# Patient Record
Sex: Female | Born: 1975 | Race: White | Hispanic: No | Marital: Married | Smoking: Current every day smoker
Health system: Southern US, Community
[De-identification: ages and names within clinical notes are randomized; demographics above are authoritative.]

## PROBLEM LIST (undated history)

## (undated) DIAGNOSIS — F909 Attention-deficit hyperactivity disorder, unspecified type: Secondary | ICD-10-CM

## (undated) HISTORY — DX: Attention-deficit hyperactivity disorder, unspecified type: F90.9

---

## 2012-10-24 ENCOUNTER — Ambulatory Visit (INDEPENDENT_AMBULATORY_CARE_PROVIDER_SITE_OTHER): Payer: BC Managed Care – PPO | Admitting: Urology

## 2012-10-24 DIAGNOSIS — N390 Urinary tract infection, site not specified: Secondary | ICD-10-CM

## 2012-12-13 ENCOUNTER — Telehealth: Payer: Self-pay | Admitting: Nurse Practitioner

## 2012-12-14 ENCOUNTER — Other Ambulatory Visit: Payer: Self-pay | Admitting: *Deleted

## 2012-12-14 MED ORDER — AMPHETAMINE-DEXTROAMPHET ER 15 MG PO CP24: 15.0000 mg | ORAL_CAPSULE | ORAL | Status: DC

## 2012-12-14 NOTE — Telephone Encounter (Signed)
Family informed RX ready for pick up

## 2012-12-14 NOTE — Telephone Encounter (Signed)
LAST REFILL 11/13/12. LAST OV 10/23/12. PLEASE PRINT

## 2012-12-19 ENCOUNTER — Encounter: Payer: Self-pay | Admitting: *Deleted

## 2013-01-11 ENCOUNTER — Ambulatory Visit: Payer: Self-pay | Admitting: Nurse Practitioner

## 2013-01-12 ENCOUNTER — Ambulatory Visit: Payer: Self-pay | Admitting: Nurse Practitioner

## 2013-01-16 ENCOUNTER — Telehealth: Payer: Self-pay | Admitting: Nurse Practitioner

## 2013-01-18 ENCOUNTER — Encounter: Payer: Self-pay | Admitting: Nurse Practitioner

## 2013-01-18 ENCOUNTER — Ambulatory Visit (INDEPENDENT_AMBULATORY_CARE_PROVIDER_SITE_OTHER): Payer: BC Managed Care – PPO | Admitting: Nurse Practitioner

## 2013-01-18 VITALS — BP 107/77 | HR 79 | Temp 97.9°F | Ht 67.0 in | Wt 177.0 lb

## 2013-01-18 DIAGNOSIS — F900 Attention-deficit hyperactivity disorder, predominantly inattentive type: Secondary | ICD-10-CM

## 2013-01-18 DIAGNOSIS — F988 Other specified behavioral and emotional disorders with onset usually occurring in childhood and adolescence: Secondary | ICD-10-CM

## 2013-01-18 MED ORDER — AMPHETAMINE-DEXTROAMPHET ER 30 MG PO CP24: 30.0000 mg | ORAL_CAPSULE | ORAL | Status: DC

## 2013-01-18 NOTE — Progress Notes (Signed)
  Subjective:    Patient ID: Stephanie Buckley, female    DOB: [birth year], 37 y.o.   MRN: [id number]  HPI  Patient in today for follow-up of adult ADHD- SHe is currently on adderall XR 15mg  1 PO qd- She doesn't always take on the weekends- Doing well- able to concentrate better but not comppletely- says that she feels that she is on a roller coaster meaning able to concentrate for several hours then can't concentrate for an hour then back to concentrating again.    Review of Systems  All other systems reviewed and are negative.       Objective:   Physical Exam  Constitutional: She appears well-developed and well-nourished.  Cardiovascular: Normal rate and normal heart sounds.   Pulmonary/Chest: Effort normal and breath sounds normal.  Skin: Skin is warm and dry.  Psychiatric: She has a normal mood and affect. Her behavior is normal. Judgment and thought content normal.    BP 107/77  Pulse 79  Temp(Src) 97.9 F (36.6 C) (Oral)  Wt 177 lb (80.287 kg)  LMP 01/11/2013       Assessment & Plan:  adhd Meds ordered this encounter  Medications  . SPRINTEC 28 0.25-35 MG-MCG tablet    Sig:   . amphetamine-dextroamphetamine (ADDERALL XR) 30 MG 24 hr capsule    Sig: Take 1 capsule (30 mg total) by mouth every morning.    Dispense:  30 capsule    Refill:  0    Order Specific Question:  Supervising Provider    Answer:  Ernestina Penna [[id number]]  . amphetamine-dextroamphetamine (ADDERALL XR) 30 MG 24 hr capsule    Sig: Take 1 capsule (30 mg total) by mouth every morning.    Dispense:  30 capsule    Refill:  0    DO NOT FILL TILL 02/18/13    Order Specific Question:  Supervising Provider    Answer:  Ernestina Penna [[id number]]  behavior modification RTO in 2 months  Mary-Margaret Daphine Deutscher, FNP

## 2013-01-18 NOTE — Patient Instructions (Signed)
Attention Deficit Hyperactivity Disorder Attention deficit hyperactivity disorder (ADHD) is a problem with behavior issues based on the way the brain functions (neurobehavioral disorder). It is a common reason for behavior and academic problems in school. CAUSES  The cause of ADHD is unknown in most cases. It may run in families. It sometimes can be associated with learning disabilities and other behavioral problems. SYMPTOMS  There are 3 types of ADHD. The 3 types and some of the symptoms include:  Inattentive  Gets bored or distracted easily.  Loses or forgets things. Forgets to hand in homework.  Has trouble organizing or completing tasks.  Difficulty staying on task.  An inability to organize daily tasks and school work.  Leaving projects, chores, or homework unfinished.  Trouble paying attention or responding to details. Careless mistakes.  Difficulty following directions. Often seems like is not listening.  Dislikes activities that require sustained attention (like chores or homework).  Hyperactive-impulsive  Feels like it is impossible to sit still or stay in a seat. Fidgeting with hands and feet.  Trouble waiting turn.  Talking too much or out of turn. Interruptive.  Speaks or acts impulsively.  Aggressive, disruptive behavior.  Constantly busy or on the go, noisy.  Combined  Has symptoms of both of the above. Often children with ADHD feel discouraged about themselves and with school. They often perform well below their abilities in school. These symptoms can cause problems in home, school, and in relationships with peers. As children get older, the excess motor activities can calm down, but the problems with paying attention and staying organized persist. Most children do not outgrow ADHD but with good treatment can learn to cope with the symptoms. DIAGNOSIS  When ADHD is suspected, the diagnosis should be made by professionals trained in ADHD.  Diagnosis will  include:  Ruling out other reasons for the child's behavior.  The caregivers will check with the child's school and check their medical records.  They will talk to teachers and parents.  Behavior rating scales for the child will be filled out by those dealing with the child on a daily basis. A diagnosis is made only after all information has been considered. TREATMENT  Treatment usually includes behavioral treatment often along with medicines. It may include stimulant medicines. The stimulant medicines decrease impulsivity and hyperactivity and increase attention. Other medicines used include antidepressants and certain blood pressure medicines. Most experts agree that treatment for ADHD should address all aspects of the child's functioning. Treatment should not be limited to the use of medicines alone. Treatment should include structured classroom management. The parents must receive education to address rewarding good behavior, discipline, and limit-setting. Tutoring or behavioral therapy or both should be available for the child. If untreated, the disorder can have long-term serious effects into adolescence and adulthood. HOME CARE INSTRUCTIONS   Often with ADHD there is a lot of frustration among the family in dealing with the illness. There is often blame and anger that is not warranted. This is a life long illness. There is no way to prevent ADHD. In many cases, because the problem affects the family as a whole, the entire family may need help. A therapist can help the family find better ways to handle the disruptive behaviors and promote change. If the child is young, most of the therapist's work is with the parents. Parents will learn techniques for coping with and improving their child's behavior. Sometimes only the child with the ADHD needs counseling. Your caregivers can help   you make these decisions.  Children with ADHD may need help in organizing. Some helpful tips include:  Keep  routines the same every day from wake-up time to bedtime. Schedule everything. This includes homework and playtime. This should include outdoor and indoor recreation. Keep the schedule on the refrigerator or a bulletin board where it is frequently seen. Mark schedule changes as far in advance as possible.  Have a place for everything and keep everything in its place. This includes clothing, backpacks, and school supplies.  Encourage writing down assignments and bringing home needed books.  Offer your child a well-balanced diet. Breakfast is especially important for school performance. Children should avoid drinks with caffeine including:  Soft drinks.  Coffee.  Tea.  However, some older children (adolescents) may find these drinks helpful in improving their attention.  Children with ADHD need consistent rules that they can understand and follow. If rules are followed, give small rewards. Children with ADHD often receive, and expect, criticism. Look for good behavior and praise it. Set realistic goals. Give clear instructions. Look for activities that can foster success and self-esteem. Make time for pleasant activities with your child. Give lots of affection.  Parents are their children's greatest advocates. Learn as much as possible about ADHD. This helps you become a stronger and better advocate for your child. It also helps you educate your child's teachers and instructors if they feel inadequate in these areas. Parent support groups are often helpful. A national group with local chapters is called CHADD (Children and Adults with Attention Deficit Hyperactivity Disorder). PROGNOSIS  There is no cure for ADHD. Children with the disorder seldom outgrow it. Many find adaptive ways to accommodate the ADHD as they mature. SEEK MEDICAL CARE IF:  Your child has repeated muscle twitches, cough or speech outbursts.  Your child has sleep problems.  Your child has a marked loss of  appetite.  Your child develops depression.  Your child has new or worsening behavioral problems.  Your child develops dizziness.  Your child has a racing heart.  Your child has stomach pains.  Your child develops headaches. Document Released: 07/30/2002 Document Revised: 11/01/2011 Document Reviewed: 03/11/2008 [employer] Patient Information 2014 [employer].  

## 2013-01-30 NOTE — Telephone Encounter (Signed)
Error

## 2013-02-28 ENCOUNTER — Telehealth: Payer: Self-pay | Admitting: Nurse Practitioner

## 2013-03-02 MED ORDER — AMPHETAMINE-DEXTROAMPHET ER 30 MG PO CP24: 30.0000 mg | ORAL_CAPSULE | ORAL | Status: DC

## 2013-03-02 NOTE — Telephone Encounter (Signed)
Aware. 

## 2013-03-02 NOTE — Telephone Encounter (Signed)
rx ready for pickup 

## 2013-03-02 NOTE — Telephone Encounter (Signed)
Not out of meds, just wanted to give plenty of time, you can write it and put "do not fill till 03/20/13. Call pt, at [phone]

## 2013-04-11 ENCOUNTER — Other Ambulatory Visit: Payer: Self-pay | Admitting: Nurse Practitioner

## 2013-04-11 MED ORDER — AMPHETAMINE-DEXTROAMPHET ER 30 MG PO CP24: 30.0000 mg | ORAL_CAPSULE | ORAL | Status: DC

## 2013-04-11 NOTE — Telephone Encounter (Signed)
Last seen 01/18/13  MMM If approved print and route to nurse

## 2013-04-11 NOTE — Telephone Encounter (Signed)
rx ready for pickup 

## 2013-04-12 NOTE — Telephone Encounter (Signed)
Patient notified that rx up front 

## 2013-05-02 ENCOUNTER — Encounter: Payer: Self-pay | Admitting: General Practice

## 2013-05-02 ENCOUNTER — Ambulatory Visit (INDEPENDENT_AMBULATORY_CARE_PROVIDER_SITE_OTHER): Payer: BC Managed Care – PPO | Admitting: General Practice

## 2013-05-02 ENCOUNTER — Telehealth: Payer: Self-pay | Admitting: Nurse Practitioner

## 2013-05-02 VITALS — BP 111/79 | HR 79 | Temp 97.3°F | Ht 67.0 in | Wt 170.0 lb

## 2013-05-02 DIAGNOSIS — H109 Unspecified conjunctivitis: Secondary | ICD-10-CM

## 2013-05-02 DIAGNOSIS — J309 Allergic rhinitis, unspecified: Secondary | ICD-10-CM

## 2013-05-02 MED ORDER — CETIRIZINE HCL 10 MG PO TABS: 10.0000 mg | ORAL_TABLET | Freq: Every day | ORAL | Status: DC

## 2013-05-02 MED ORDER — OFLOXACIN 0.3 % OP SOLN: OPHTHALMIC | Status: DC

## 2013-05-02 NOTE — Patient Instructions (Addendum)

## 2013-05-02 NOTE — Progress Notes (Signed)
  Subjective:    Patient ID: Stephanie Buckley, female    DOB: 09-09-1975, 37 y.o.   MRN: [id number]  Conjunctivitis  The current episode started 2 days ago. The onset was gradual. The problem has been gradually worsening. The problem is mild. Nothing relieves the symptoms. Associated symptoms include eye itching and photophobia. Pertinent negatives include no fever, no decreased vision, no double vision, no congestion, no ear pain, no headaches, no hearing loss, no sore throat, no neck pain, no neck stiffness, no cough, no URI, no rash, no eye discharge and no eye pain.      Review of Systems  Constitutional: Negative for fever and chills.  HENT: Negative for hearing loss, ear pain, congestion, sore throat, neck pain and neck stiffness.   Eyes: Positive for photophobia and itching. Negative for double vision, pain, discharge and visual disturbance.  Respiratory: Negative for cough and chest tightness.   Cardiovascular: Negative for chest pain and palpitations.  Skin: Negative for rash.  Neurological: Negative for dizziness, weakness and headaches.       Objective:   Physical Exam  Constitutional: She is oriented to person, place, and time. She appears well-developed and well-nourished.  HENT:  Head: Normocephalic and atraumatic.  Right Ear: External ear normal.  Left Ear: External ear normal.  Mouth/Throat: Posterior oropharyngeal erythema present.  Eyes: EOM are normal. Pupils are equal, round, and reactive to light. Right conjunctiva is injected. Right conjunctiva has no hemorrhage. Left conjunctiva is not injected. Left conjunctiva has no hemorrhage.  Neck: Normal range of motion. Neck supple. No thyromegaly present.  Pulmonary/Chest: Effort normal and breath sounds normal.  Lymphadenopathy:    She has no cervical adenopathy.  Neurological: She is alert and oriented to person, place, and time.  Skin: Skin is warm and dry.          Assessment & Plan:  1. Conjunctivitis -  ofloxacin (OCUFLOX) 0.3 % ophthalmic solution; 2 drops in affected eye (s), every 4 hours x 2 days, then 1 drop four times daily for 5 days  Dispense: 10 mL; Refill: 1 -information sheet provided and discussed on conjunctivitis  -RTO office if symptoms worsen or unresolved -Patient verbalized understanding -Coralie Keens, FNP-C   2. Allergic rhinitis - cetirizine (ZYRTEC) 10 MG tablet; Take 1 tablet (10 mg total) by mouth daily.  Dispense: 30 tablet; Refill: 6

## 2013-05-02 NOTE — Telephone Encounter (Signed)
appt made

## 2013-05-11 ENCOUNTER — Ambulatory Visit (INDEPENDENT_AMBULATORY_CARE_PROVIDER_SITE_OTHER): Payer: BC Managed Care – PPO | Admitting: Nurse Practitioner

## 2013-05-11 ENCOUNTER — Encounter: Payer: Self-pay | Admitting: Nurse Practitioner

## 2013-05-11 VITALS — BP 110/73 | HR 83 | Temp 97.2°F | Ht 67.0 in | Wt 170.0 lb

## 2013-05-11 DIAGNOSIS — F908 Attention-deficit hyperactivity disorder, other type: Secondary | ICD-10-CM | POA: Insufficient documentation

## 2013-05-11 DIAGNOSIS — F909 Attention-deficit hyperactivity disorder, unspecified type: Secondary | ICD-10-CM

## 2013-05-11 MED ORDER — AMPHETAMINE-DEXTROAMPHET ER 30 MG PO CP24: 30.0000 mg | ORAL_CAPSULE | ORAL | Status: DC

## 2013-05-11 NOTE — Progress Notes (Signed)
  Subjective:    Patient ID: Stephanie Buckley, female    DOB: [birth year], 37 y.o.   MRN: [id number]  HPI Pt here for f/u with ADHD. Pt currently Adderall XR 30 daily. Pt denies any complaints or concerns. Pt reports a hugh improvement on concentration.    Review of Systems  All other systems reviewed and are negative.       Objective:   Physical Exam  Vitals reviewed. Constitutional: She is oriented to person, place, and time.  Cardiovascular: Normal rate, regular rhythm, normal heart sounds and intact distal pulses.   Pulmonary/Chest: Effort normal and breath sounds normal. No respiratory distress.  Musculoskeletal: Normal range of motion. She exhibits no edema and no tenderness.  Neurological: She is alert and oriented to person, place, and time.  Psychiatric: She has a normal mood and affect. Her behavior is normal. Judgment and thought content normal.   BP 110/73  Pulse 83  Temp(Src) 97.2 F (36.2 C) (Oral)  Ht 5\' 7"  (1.702 m)  Wt 170 lb (77.111 kg)  BMI 26.62 kg/m2        Assessment & Plan:  1. ADHD (attention deficit hyperactivity disorder) Continue stress management - amphetamine-dextroamphetamine (ADDERALL XR) 30 MG 24 hr capsule; Take 1 capsule (30 mg total) by mouth every morning.  Dispense: 30 capsule; Refill: 0 - amphetamine-dextroamphetamine (ADDERALL XR) 30 MG 24 hr capsule; Take 1 capsule (30 mg total) by mouth every morning.  Dispense: 30 capsule; Refill: 0 DO NOT FILL TILL 06/09/13  Follow up in 2-3 months  Mary-Margaret Daphine Deutscher, FNP

## 2013-05-11 NOTE — Patient Instructions (Addendum)
Attention Deficit Hyperactivity Disorder Attention deficit hyperactivity disorder (ADHD) is a problem with behavior issues based on the way the brain functions (neurobehavioral disorder). It is a common reason for behavior and academic problems in school. CAUSES  The cause of ADHD is unknown in most cases. It may run in families. It sometimes can be associated with learning disabilities and other behavioral problems. SYMPTOMS  There are 3 types of ADHD. The 3 types and some of the symptoms include:  Inattentive  Gets bored or distracted easily.  Loses or forgets things. Forgets to hand in homework.  Has trouble organizing or completing tasks.  Difficulty staying on task.  An inability to organize daily tasks and school work.  Leaving projects, chores, or homework unfinished.  Trouble paying attention or responding to details. Careless mistakes.  Difficulty following directions. Often seems like is not listening.  Dislikes activities that require sustained attention (like chores or homework).  Hyperactive-impulsive  Feels like it is impossible to sit still or stay in a seat. Fidgeting with hands and feet.  Trouble waiting turn.  Talking too much or out of turn. Interruptive.  Speaks or acts impulsively.  Aggressive, disruptive behavior.  Constantly busy or on the go, noisy.  Combined  Has symptoms of both of the above. Often children with ADHD feel discouraged about themselves and with school. They often perform well below their abilities in school. These symptoms can cause problems in home, school, and in relationships with peers. As children get older, the excess motor activities can calm down, but the problems with paying attention and staying organized persist. Most children do not outgrow ADHD but with good treatment can learn to cope with the symptoms. DIAGNOSIS  When ADHD is suspected, the diagnosis should be made by professionals trained in ADHD.  Diagnosis will  include:  Ruling out other reasons for the child's behavior.  The caregivers will check with the child's school and check their medical records.  They will talk to teachers and parents.  Behavior rating scales for the child will be filled out by those dealing with the child on a daily basis. A diagnosis is made only after all information has been considered. TREATMENT  Treatment usually includes behavioral treatment often along with medicines. It may include stimulant medicines. The stimulant medicines decrease impulsivity and hyperactivity and increase attention. Other medicines used include antidepressants and certain blood pressure medicines. Most experts agree that treatment for ADHD should address all aspects of the child's functioning. Treatment should not be limited to the use of medicines alone. Treatment should include structured classroom management. The parents must receive education to address rewarding good behavior, discipline, and limit-setting. Tutoring or behavioral therapy or both should be available for the child. If untreated, the disorder can have long-term serious effects into adolescence and adulthood. HOME CARE INSTRUCTIONS   Often with ADHD there is a lot of frustration among the family in dealing with the illness. There is often blame and anger that is not warranted. This is a life long illness. There is no way to prevent ADHD. In many cases, because the problem affects the family as a whole, the entire family may need help. A therapist can help the family find better ways to handle the disruptive behaviors and promote change. If the child is young, most of the therapist's work is with the parents. Parents will learn techniques for coping with and improving their child's behavior. Sometimes only the child with the ADHD needs counseling. Your caregivers can help   you make these decisions.  Children with ADHD may need help in organizing. Some helpful tips include:  Keep  routines the same every day from wake-up time to bedtime. Schedule everything. This includes homework and playtime. This should include outdoor and indoor recreation. Keep the schedule on the refrigerator or a bulletin board where it is frequently seen. Mark schedule changes as far in advance as possible.  Have a place for everything and keep everything in its place. This includes clothing, backpacks, and school supplies.  Encourage writing down assignments and bringing home needed books.  Offer your child a well-balanced diet. Breakfast is especially important for school performance. Children should avoid drinks with caffeine including:  Soft drinks.  Coffee.  Tea.  However, some older children (adolescents) may find these drinks helpful in improving their attention.  Children with ADHD need consistent rules that they can understand and follow. If rules are followed, give small rewards. Children with ADHD often receive, and expect, criticism. Look for good behavior and praise it. Set realistic goals. Give clear instructions. Look for activities that can foster success and self-esteem. Make time for pleasant activities with your child. Give lots of affection.  Parents are their children's greatest advocates. Learn as much as possible about ADHD. This helps you become a stronger and better advocate for your child. It also helps you educate your child's teachers and instructors if they feel inadequate in these areas. Parent support groups are often helpful. A national group with local chapters is called CHADD (Children and Adults with Attention Deficit Hyperactivity Disorder). PROGNOSIS  There is no cure for ADHD. Children with the disorder seldom outgrow it. Many find adaptive ways to accommodate the ADHD as they mature. SEEK MEDICAL CARE IF:  Your child has repeated muscle twitches, cough or speech outbursts.  Your child has sleep problems.  Your child has a marked loss of  appetite.  Your child develops depression.  Your child has new or worsening behavioral problems.  Your child develops dizziness.  Your child has a racing heart.  Your child has stomach pains.  Your child develops headaches. Document Released: 07/30/2002 Document Revised: 11/01/2011 Document Reviewed: 03/11/2008 [employer] Patient Information 2014 [employer].  

## 2013-07-04 ENCOUNTER — Other Ambulatory Visit: Payer: Self-pay | Admitting: Nurse Practitioner

## 2013-07-04 DIAGNOSIS — F909 Attention-deficit hyperactivity disorder, unspecified type: Secondary | ICD-10-CM

## 2013-07-04 MED ORDER — AMPHETAMINE-DEXTROAMPHET ER 30 MG PO CP24: 30.0000 mg | ORAL_CAPSULE | ORAL | Status: DC

## 2013-07-11 ENCOUNTER — Ambulatory Visit: Payer: BC Managed Care – PPO | Admitting: Nurse Practitioner

## 2013-08-09 ENCOUNTER — Telehealth: Payer: Self-pay | Admitting: Nurse Practitioner

## 2013-08-09 DIAGNOSIS — F909 Attention-deficit hyperactivity disorder, unspecified type: Secondary | ICD-10-CM

## 2013-08-09 MED ORDER — AMPHETAMINE-DEXTROAMPHET ER 30 MG PO CP24: 30.0000 mg | ORAL_CAPSULE | ORAL | Status: DC

## 2013-08-09 NOTE — Telephone Encounter (Signed)
Patient aware.

## 2013-08-09 NOTE — Telephone Encounter (Signed)
rx ready for pickup 

## 2013-09-05 ENCOUNTER — Encounter: Payer: Self-pay | Admitting: Nurse Practitioner

## 2013-09-05 ENCOUNTER — Ambulatory Visit (INDEPENDENT_AMBULATORY_CARE_PROVIDER_SITE_OTHER): Payer: BC Managed Care – PPO | Admitting: Nurse Practitioner

## 2013-09-05 VITALS — BP 116/74 | HR 92 | Temp 97.0°F | Ht 67.0 in | Wt 175.0 lb

## 2013-09-05 DIAGNOSIS — F909 Attention-deficit hyperactivity disorder, unspecified type: Secondary | ICD-10-CM

## 2013-09-05 MED ORDER — AMPHETAMINE-DEXTROAMPHET ER 30 MG PO CP24: 30.0000 mg | ORAL_CAPSULE | ORAL | Status: DC

## 2013-09-05 NOTE — Progress Notes (Signed)
   Subjective:    Patient ID: Stephanie Buckley, female    DOB: 04-02-1976, 38 y.o.   MRN: [id number]  HPI  Patient in today for ADHD follow up- SHe is currently on adderallXR 30 mg dialy- helping her to concentrate and feel normal- able to complete tasks at work. No side effects.    Review of Systems  Constitutional: Negative.   HENT: Negative.   Respiratory: Negative.   Cardiovascular: Negative.   Genitourinary: Negative.   All other systems reviewed and are negative.       Objective:   Physical Exam  Constitutional: She appears well-developed and well-nourished.  Cardiovascular: Normal rate, regular rhythm and normal heart sounds.   Pulmonary/Chest: Effort normal and breath sounds normal.  Neurological: She is alert.  Skin: Skin is warm and dry.  Psychiatric: She has a normal mood and affect. Her behavior is normal. Judgment and thought content normal.    BP 116/74  Pulse 92  Temp(Src) 97 F (36.1 C) (Oral)  Ht 5\' 7"  (1.702 m)  Wt 175 lb (79.379 kg)  BMI 27.40 kg/m2       Assessment & Plan:   1. ADHD (attention deficit hyperactivity disorder)    Meds ordered this encounter  Medications  . amphetamine-dextroamphetamine (ADDERALL XR) 30 MG 24 hr capsule    Sig: Take 1 capsule (30 mg total) by mouth every morning.    Dispense:  30 capsule    Refill:  0    DO NOT FILL TILL 10/05/13    Order Specific Question:  Supervising Provider    Answer:  Chipper Herb [[id number]]  . amphetamine-dextroamphetamine (ADDERALL XR) 30 MG 24 hr capsule    Sig: Take 1 capsule (30 mg total) by mouth every morning.    Dispense:  30 capsule    Refill:  0    Order Specific Question:  Supervising Provider    Answer:  Chipper Herb [[id number]]   Stress management Follow up in 2 months  Mary-Margaret Hassell Done, FNP

## 2013-09-05 NOTE — Patient Instructions (Signed)
Stress Management Stress is a state of physical or mental tension that often results from changes in your life or normal routine. Some common causes of stress are:  Death of a loved one.  Injuries or severe illnesses.  Getting fired or changing jobs.  Moving into a new home. Other causes may be:  Sexual problems.  Business or financial losses.  Taking on a large debt.  Regular conflict with someone at home or at work.  Constant tiredness from lack of sleep. It is not just bad things that are stressful. It may be stressful to:  Win the lottery.  Get married.  Buy a new car. The amount of stress that can be easily tolerated varies from person to person. Changes generally cause stress, regardless of the types of change. Too much stress can affect your health. It may lead to physical or emotional problems. Too little stress (boredom) may also become stressful. SUGGESTIONS TO REDUCE STRESS:  Talk things over with your family and friends. It often is helpful to share your concerns and worries. If you feel your problem is serious, you may want to get help from a professional counselor.  Consider your problems one at a time instead of lumping them all together. Trying to take care of everything at once may seem impossible. List all the things you need to do and then start with the most important one. Set a goal to accomplish 2 or 3 things each day. If you expect to do too many in a single day you will naturally fail, causing you to feel even more stressed.  Do not use alcohol or drugs to relieve stress. Although you may feel better for a short time, they do not remove the problems that caused the stress. They can also be habit forming.  Exercise regularly - at least 3 times per week. Physical exercise can help to relieve that "uptight" feeling and will relax you.  The shortest distance between despair and hope is often a good night's sleep.  Go to bed and get up on time allowing  yourself time for appointments without being rushed.  Take a short "time-out" period from any stressful situation that occurs during the day. Close your eyes and take some deep breaths. Starting with the muscles in your face, tense them, hold it for a few seconds, then relax. Repeat this with the muscles in your neck, shoulders, hand, stomach, back and legs.  Take good care of yourself. Eat a balanced diet and get plenty of rest.  Schedule time for having fun. Take a break from your daily routine to relax. HOME CARE INSTRUCTIONS   Call if you feel overwhelmed by your problems and feel you can no longer manage them on your own.  Return immediately if you feel like hurting yourself or someone else. Document Released: 02/02/2001 Document Revised: 11/01/2011 Document Reviewed: 04/03/2013 [employer] Patient Information 2014 [employer].  

## 2013-09-26 ENCOUNTER — Telehealth: Payer: Self-pay | Admitting: Nurse Practitioner

## 2013-09-26 NOTE — Telephone Encounter (Signed)
Patient called back concerned about flu exposure. They spent the weekend with a child who developed symptoms while with them 3 days ago and then tested positive yesterday. Do you recommend prophylactic treatment for flu?

## 2013-09-26 NOTE — Telephone Encounter (Signed)
Please advise 

## 2013-09-27 ENCOUNTER — Other Ambulatory Visit: Payer: Self-pay | Admitting: Nurse Practitioner

## 2013-09-27 ENCOUNTER — Telehealth: Payer: Self-pay | Admitting: Nurse Practitioner

## 2013-09-27 MED ORDER — OSELTAMIVIR PHOSPHATE 75 MG PO CAPS: 75.0000 mg | ORAL_CAPSULE | Freq: Every day | ORAL | Status: DC

## 2013-09-27 MED ORDER — OSELTAMIVIR PHOSPHATE 6 MG/ML PO SUSR: 45.0000 mg | Freq: Every day | ORAL | Status: DC

## 2013-09-27 NOTE — Telephone Encounter (Signed)
This is suppose to be 75mg  daily for 10 days. Pharmacy notified.

## 2013-11-09 ENCOUNTER — Telehealth: Payer: Self-pay | Admitting: Family Medicine

## 2013-11-09 DIAGNOSIS — F909 Attention-deficit hyperactivity disorder, unspecified type: Secondary | ICD-10-CM

## 2013-11-09 MED ORDER — AMPHETAMINE-DEXTROAMPHET ER 30 MG PO CP24: 30.0000 mg | ORAL_CAPSULE | ORAL | Status: DC

## 2013-11-09 NOTE — Telephone Encounter (Signed)
Patient aware.

## 2013-11-09 NOTE — Telephone Encounter (Signed)
rx ready for pickup 

## 2013-11-12 ENCOUNTER — Telehealth: Payer: Self-pay | Admitting: Nurse Practitioner

## 2013-11-28 ENCOUNTER — Ambulatory Visit (INDEPENDENT_AMBULATORY_CARE_PROVIDER_SITE_OTHER): Payer: BC Managed Care – PPO | Admitting: Nurse Practitioner

## 2013-11-28 ENCOUNTER — Encounter: Payer: Self-pay | Admitting: Nurse Practitioner

## 2013-11-28 VITALS — BP 115/71 | HR 88 | Temp 98.2°F | Ht 67.0 in | Wt 175.4 lb

## 2013-11-28 DIAGNOSIS — F909 Attention-deficit hyperactivity disorder, unspecified type: Secondary | ICD-10-CM

## 2013-11-28 MED ORDER — AMPHETAMINE-DEXTROAMPHET ER 30 MG PO CP24: 30.0000 mg | ORAL_CAPSULE | ORAL | Status: DC

## 2013-11-28 NOTE — Patient Instructions (Signed)
Attention Deficit Hyperactivity Disorder Attention deficit hyperactivity disorder (ADHD) is a problem with behavior issues based on the way the brain functions (neurobehavioral disorder). It is a common reason for behavior and academic problems in school. SYMPTOMS  There are 3 types of ADHD. The 3 types and some of the symptoms include:  Inattentive  Gets bored or distracted easily.  Loses or forgets things. Forgets to hand in homework.  Has trouble organizing or completing tasks.  Difficulty staying on task.  An inability to organize daily tasks and school work.  Leaving projects, chores, or homework unfinished.  Trouble paying attention or responding to details. Careless mistakes.  Difficulty following directions. Often seems like is not listening.  Dislikes activities that require sustained attention (like chores or homework).  Hyperactive-impulsive  Feels like it is impossible to sit still or stay in a seat. Fidgeting with hands and feet.  Trouble waiting turn.  Talking too much or out of turn. Interruptive.  Speaks or acts impulsively.  Aggressive, disruptive behavior.  Constantly busy or on the go, noisy.  Often leaves seat when they are expected to remain seated.  Often runs or climbs where it is not appropriate, or feels very restless.  Combined  Has symptoms of both of the above. Often children with ADHD feel discouraged about themselves and with school. They often perform well below their abilities in school. As children get older, the excess motor activities can calm down, but the problems with paying attention and staying organized persist. Most children do not outgrow ADHD but with good treatment can learn to cope with the symptoms. DIAGNOSIS  When ADHD is suspected, the diagnosis should be made by professionals trained in ADHD. This professional will collect information about the individual suspected of having ADHD. Information must be collected from  various settings where the person lives, works, or attends school.  Diagnosis will include:  Confirming symptoms began in childhood.  Ruling out other reasons for the child's behavior.  The health care providers will check with the child's school and check their medical records.  They will talk to teachers and parents.  Behavior rating scales for the child will be filled out by those dealing with the child on a daily basis. A diagnosis is made only after all information has been considered. TREATMENT  Treatment usually includes behavioral treatment, tutoring or extra support in school, and stimulant medicines. Because of the way a person's brain works with ADHD, these medicines decrease impulsivity and hyperactivity and increase attention. This is different than how they would work in a person who does not have ADHD. Other medicines used include antidepressants and certain blood pressure medicines. Most experts agree that treatment for ADHD should address all aspects of the person's functioning. Along with medicines, treatment should include structured classroom management at school. Parents should reward good behavior, provide constant discipline, and limit-setting. Tutoring should be available for the child as needed. ADHD is a life-long condition. If untreated, the disorder can have long-term serious effects into adolescence and adulthood. HOME CARE INSTRUCTIONS   Often with ADHD there is a lot of frustration among family members dealing with the condition. Blame and anger are also feelings that are common. In many cases, because the problem affects the family as a whole, the entire family may need help. A therapist can help the family find better ways to handle the disruptive behaviors of the person with ADHD and promote change. If the person with ADHD is young, most of the therapist's work   is with the parents. Parents will learn techniques for coping with and improving their child's  behavior. Sometimes only the child with the ADHD needs counseling. Your health care providers can help you make these decisions.  Children with ADHD may need help learning how to organize. Some helpful tips include:  Keep routines the same every day from wake-up time to bedtime. Schedule all activities, including homework and playtime. Keep the schedule in a place where the person with ADHD will often see it. Mark schedule changes as far in advance as possible.  Schedule outdoor and indoor recreation.  Have a place for everything and keep everything in its place. This includes clothing, backpacks, and school supplies.  Encourage writing down assignments and bringing home needed books. Work with your child's teachers for assistance in organizing school work.  Offer your child a well-balanced diet. Breakfast that includes a balance of whole grains, protein and, fruits or vegetables is especially important for school performance. Children should avoid drinks with caffeine including:  Soft drinks.  Coffee.  Tea.  However, some older children (adolescents) may find these drinks helpful in improving their attention. Because it can also be common for adolescents with ADHD to become addicted to caffeine, talk with your health care provider about what is a safe amount of caffeine intake for your child.  Children with ADHD need consistent rules that they can understand and follow. If rules are followed, give small rewards. Children with ADHD often receive, and expect, criticism. Look for good behavior and praise it. Set realistic goals. Give clear instructions. Look for activities that can foster success and self-esteem. Make time for pleasant activities with your child. Give lots of affection.  Parents are their children's greatest advocates. Learn as much as possible about ADHD. This helps you become a stronger and better advocate for your child. It also helps you educate your child's teachers and  instructors if they feel inadequate in these areas. Parent support groups are often helpful. A national group with local chapters is called Children and Adults with Attention Deficit Hyperactivity Disorder (CHADD). SEEK MEDICAL CARE IF:  Your child has repeated muscle twitches, cough or speech outbursts.  Your child has sleep problems.  Your child has a marked loss of appetite.  Your child develops depression.  Your child has new or worsening behavioral problems.  Your child develops dizziness.  Your child has a racing heart.  Your child has stomach pains.  Your child develops headaches. SEEK IMMEDIATE MEDICAL CARE IF:  Your child has been diagnosed with depression or anxiety and the symptoms seem to be getting worse.  Your child has been depressed and suddenly appears to have increased energy or motivation.  You are worried that your child is having a bad reaction to a medication he or she is taking for ADHD. Document Released: 07/30/2002 Document Revised: 05/30/2013 Document Reviewed: 04/16/2013 [employer] Patient Information 2014 [employer].  

## 2013-11-28 NOTE — Progress Notes (Signed)
   Subjective:    Patient ID: Stephanie Buckley, female    DOB: 1976/08/13, 38 y.o.   MRN: [id number]  HPI Patient in today for follow up of ADD- SH e ia currently on adderallXR 30 mg daily- DOing well on current dose- able to focus well and get work done at work. No side effects.    Review of Systems  Constitutional: Negative.   HENT: Negative.   Respiratory: Negative.   Cardiovascular: Negative.   Musculoskeletal: Negative.   Psychiatric/Behavioral: Negative.   All other systems reviewed and are negative.      Objective:   Physical Exam  Constitutional: She is oriented to person, place, and time. She appears well-developed and well-nourished.  Cardiovascular: Normal rate, regular rhythm and normal heart sounds.   Pulmonary/Chest: Effort normal and breath sounds normal.  Neurological: She is alert and oriented to person, place, and time.  Skin: Skin is warm.  Psychiatric: She has a normal mood and affect. Her behavior is normal. Judgment and thought content normal.   BP 115/71  Pulse 88  Temp(Src) 98.2 F (36.8 C) (Oral)  Ht 5\' 7"  (1.702 m)  Wt 175 lb 6.4 oz (79.561 kg)  BMI 27.47 kg/m2        Assessment & Plan:   1. ADHD (attention deficit hyperactivity disorder)    Meds ordered this encounter  Medications  . amphetamine-dextroamphetamine (ADDERALL XR) 30 MG 24 hr capsule    Sig: Take 1 capsule (30 mg total) by mouth every morning.    Dispense:  30 capsule    Refill:  0    Order Specific Question:  Supervising Provider    Answer:  Chipper Herb [[id number]]  . amphetamine-dextroamphetamine (ADDERALL XR) 30 MG 24 hr capsule    Sig: Take 1 capsule (30 mg total) by mouth every morning.    Dispense:  30 capsule    Refill:  0    DO  NOT FILL TILL 12/28/13    Order Specific Question:  Supervising Provider    Answer:  Chipper Herb [[id number]]  patient will schedule CPE Meds as prescribed Behavior modification as needed Follow-up for recheck in 2 months  Mary-Margaret  Hassell Done, FNP

## 2013-12-28 ENCOUNTER — Ambulatory Visit (INDEPENDENT_AMBULATORY_CARE_PROVIDER_SITE_OTHER): Payer: BC Managed Care – PPO | Admitting: Nurse Practitioner

## 2013-12-28 ENCOUNTER — Encounter: Payer: Self-pay | Admitting: Nurse Practitioner

## 2013-12-28 VITALS — BP 115/75 | HR 75 | Temp 98.6°F | Wt 177.8 lb

## 2013-12-28 DIAGNOSIS — D239 Other benign neoplasm of skin, unspecified: Secondary | ICD-10-CM

## 2013-12-28 DIAGNOSIS — D229 Melanocytic nevi, unspecified: Secondary | ICD-10-CM

## 2013-12-28 NOTE — Progress Notes (Signed)
   Subjective:    Patient ID: Stephanie Buckley, female    DOB: Jul 01, 1976, 38 y.o.   MRN: [id number]  HPI Patient here today for skin tag removal.    Review of Systems  All other systems reviewed and are negative.      Objective:   Physical Exam  Constitutional: She appears well-developed and well-nourished.  Cardiovascular: Normal rate, regular rhythm and normal heart sounds.   Skin:  1 cm flesh colored lesion right post shoulder 1cm red pigmented lesion mid upper abd wall   Procedure- Both areas numbed with lido 2% with epi   Cleaned with betadine   Lesions shaved off and cauterized with silver nitrate sticks   Cleaned and band aid applied       Assessment & Plan:  1. Benign mole removal Keep clean and dry Watch for signs of infection rto prn  Mary-Margaret Hassell Done, FNP

## 2014-02-04 ENCOUNTER — Telehealth: Payer: Self-pay | Admitting: Nurse Practitioner

## 2014-02-04 DIAGNOSIS — F909 Attention-deficit hyperactivity disorder, unspecified type: Secondary | ICD-10-CM

## 2014-02-04 MED ORDER — AMPHETAMINE-DEXTROAMPHET ER 30 MG PO CP24: 30.0000 mg | ORAL_CAPSULE | ORAL | Status: DC

## 2014-02-04 NOTE — Telephone Encounter (Signed)
Message left on cell phone that rx is up front and ready to be picked up

## 2014-02-04 NOTE — Telephone Encounter (Signed)
rx ready for pickup 

## 2014-02-05 ENCOUNTER — Encounter: Payer: Self-pay | Admitting: Nurse Practitioner

## 2014-02-05 ENCOUNTER — Ambulatory Visit (INDEPENDENT_AMBULATORY_CARE_PROVIDER_SITE_OTHER): Payer: BC Managed Care – PPO | Admitting: Nurse Practitioner

## 2014-02-05 VITALS — BP 108/78 | HR 94 | Temp 98.2°F | Ht 67.0 in | Wt 179.2 lb

## 2014-02-05 DIAGNOSIS — F909 Attention-deficit hyperactivity disorder, unspecified type: Secondary | ICD-10-CM

## 2014-02-05 DIAGNOSIS — F908 Attention-deficit hyperactivity disorder, other type: Secondary | ICD-10-CM

## 2014-02-05 MED ORDER — AMPHETAMINE-DEXTROAMPHET ER 30 MG PO CP24: 30.0000 mg | ORAL_CAPSULE | ORAL | Status: DC

## 2014-02-05 NOTE — Patient Instructions (Signed)
Attention Deficit Hyperactivity Disorder Attention deficit hyperactivity disorder (ADHD) is a problem with behavior issues based on the way the brain functions (neurobehavioral disorder). It is a common reason for behavior and academic problems in school. SYMPTOMS  There are 3 types of ADHD. The 3 types and some of the symptoms include:  Inattentive  Gets bored or distracted easily.  Loses or forgets things. Forgets to hand in homework.  Has trouble organizing or completing tasks.  Difficulty staying on task.  An inability to organize daily tasks and school work.  Leaving projects, chores, or homework unfinished.  Trouble paying attention or responding to details. Careless mistakes.  Difficulty following directions. Often seems like is not listening.  Dislikes activities that require sustained attention (like chores or homework).  Hyperactive-impulsive  Feels like it is impossible to sit still or stay in a seat. Fidgeting with hands and feet.  Trouble waiting turn.  Talking too much or out of turn. Interruptive.  Speaks or acts impulsively.  Aggressive, disruptive behavior.  Constantly busy or on the go, noisy.  Often leaves seat when they are expected to remain seated.  Often runs or climbs where it is not appropriate, or feels very restless.  Combined  Has symptoms of both of the above. Often children with ADHD feel discouraged about themselves and with school. They often perform well below their abilities in school. As children get older, the excess motor activities can calm down, but the problems with paying attention and staying organized persist. Most children do not outgrow ADHD but with good treatment can learn to cope with the symptoms. DIAGNOSIS  When ADHD is suspected, the diagnosis should be made by professionals trained in ADHD. This professional will collect information about the individual suspected of having ADHD. Information must be collected from  various settings where the person lives, works, or attends school.  Diagnosis will include:  Confirming symptoms began in childhood.  Ruling out other reasons for the child's behavior.  The health care providers will check with the child's school and check their medical records.  They will talk to teachers and parents.  Behavior rating scales for the child will be filled out by those dealing with the child on a daily basis. A diagnosis is made only after all information has been considered. TREATMENT  Treatment usually includes behavioral treatment, tutoring or extra support in school, and stimulant medicines. Because of the way a person's brain works with ADHD, these medicines decrease impulsivity and hyperactivity and increase attention. This is different than how they would work in a person who does not have ADHD. Other medicines used include antidepressants and certain blood pressure medicines. Most experts agree that treatment for ADHD should address all aspects of the person's functioning. Along with medicines, treatment should include structured classroom management at school. Parents should reward good behavior, provide constant discipline, and limit-setting. Tutoring should be available for the child as needed. ADHD is a life-long condition. If untreated, the disorder can have long-term serious effects into adolescence and adulthood. HOME CARE INSTRUCTIONS   Often with ADHD there is a lot of frustration among family members dealing with the condition. Blame and anger are also feelings that are common. In many cases, because the problem affects the family as a whole, the entire family may need help. A therapist can help the family find better ways to handle the disruptive behaviors of the person with ADHD and promote change. If the person with ADHD is young, most of the therapist's work   is with the parents. Parents will learn techniques for coping with and improving their child's  behavior. Sometimes only the child with the ADHD needs counseling. Your health care providers can help you make these decisions.  Children with ADHD may need help learning how to organize. Some helpful tips include:  Keep routines the same every day from wake-up time to bedtime. Schedule all activities, including homework and playtime. Keep the schedule in a place where the person with ADHD will often see it. Mark schedule changes as far in advance as possible.  Schedule outdoor and indoor recreation.  Have a place for everything and keep everything in its place. This includes clothing, backpacks, and school supplies.  Encourage writing down assignments and bringing home needed books. Work with your child's teachers for assistance in organizing school work.  Offer your child a well-balanced diet. Breakfast that includes a balance of whole grains, protein and, fruits or vegetables is especially important for school performance. Children should avoid drinks with caffeine including:  Soft drinks.  Coffee.  Tea.  However, some older children (adolescents) may find these drinks helpful in improving their attention. Because it can also be common for adolescents with ADHD to become addicted to caffeine, talk with your health care Yadriel Kerrigan about what is a safe amount of caffeine intake for your child.  Children with ADHD need consistent rules that they can understand and follow. If rules are followed, give small rewards. Children with ADHD often receive, and expect, criticism. Look for good behavior and praise it. Set realistic goals. Give clear instructions. Look for activities that can foster success and self-esteem. Make time for pleasant activities with your child. Give lots of affection.  Parents are their children's greatest advocates. Learn as much as possible about ADHD. This helps you become a stronger and better advocate for your child. It also helps you educate your child's teachers and  instructors if they feel inadequate in these areas. Parent support groups are often helpful. A national group with local chapters is called Children and Adults with Attention Deficit Hyperactivity Disorder (CHADD). SEEK MEDICAL CARE IF:  Your child has repeated muscle twitches, cough or speech outbursts.  Your child has sleep problems.  Your child has a marked loss of appetite.  Your child develops depression.  Your child has new or worsening behavioral problems.  Your child develops dizziness.  Your child has a racing heart.  Your child has stomach pains.  Your child develops headaches. SEEK IMMEDIATE MEDICAL CARE IF:  Your child has been diagnosed with depression or anxiety and the symptoms seem to be getting worse.  Your child has been depressed and suddenly appears to have increased energy or motivation.  You are worried that your child is having a bad reaction to a medication he or she is taking for ADHD. Document Released: 07/30/2002 Document Revised: 05/30/2013 Document Reviewed: 04/16/2013 [employer] Patient Information 2014 [employer].  

## 2014-02-05 NOTE — Progress Notes (Signed)
   Subjective:    Patient ID: Stephanie Buckley, female    DOB: 1976-05-17, 38 y.o.   MRN: [id number]  [facility]R 30 daily. Doing well on dose- helps her to concentrate.    Review of Systems  Constitutional: Negative.   HENT: Negative.   Respiratory: Negative.   Cardiovascular: Negative.   Genitourinary: Negative.   Psychiatric/Behavioral: Negative.   All other systems reviewed and are negative.      Objective:   Physical Exam  Constitutional: She is oriented to person, place, and time. She appears well-developed and well-nourished.  Cardiovascular: Normal rate, regular rhythm and normal heart sounds.   Pulmonary/Chest: Effort normal and breath sounds normal.  Neurological: She is alert and oriented to person, place, and time.  Skin: Skin is warm and dry.  Psychiatric: She has a normal mood and affect. Her behavior is normal. Judgment and thought content normal.   BP 108/78  Pulse 94  Temp(Src) 98.2 F (36.8 C) (Oral)  Ht 5\' 7"  (1.702 m)  Wt 179 lb 3.2 oz (81.285 kg)  BMI 28.06 kg/m2        Assessment & Plan:   1. ADHD, adult residual type   2. ADHD (attention deficit hyperactivity disorder)    Meds ordered this encounter  Medications  . amphetamine-dextroamphetamine (ADDERALL XR) 30 MG 24 hr capsule    Sig: Take 1 capsule (30 mg total) by mouth every morning.    Dispense:  30 capsule    Refill:  0    DO  NOT FILL TILL 03/07/14    Order Specific Question:  Supervising Provider    Answer:  Chipper Herb [[id number]]   Stress management Follow up in 2 months  Mary-Margaret Hassell Done, FNP

## 2014-02-11 NOTE — Telephone Encounter (Signed)
RX wrote 02-04-14.

## 2014-02-26 ENCOUNTER — Other Ambulatory Visit: Payer: BC Managed Care – PPO

## 2014-02-26 ENCOUNTER — Other Ambulatory Visit: Payer: Self-pay | Admitting: *Deleted

## 2014-02-26 DIAGNOSIS — Z30013 Encounter for initial prescription of injectable contraceptive: Secondary | ICD-10-CM

## 2014-02-26 NOTE — Progress Notes (Unsigned)
PT CAME IN FOR LAB ONLY

## 2014-02-27 ENCOUNTER — Ambulatory Visit: Payer: BC Managed Care – PPO

## 2014-02-27 ENCOUNTER — Ambulatory Visit (INDEPENDENT_AMBULATORY_CARE_PROVIDER_SITE_OTHER): Payer: BC Managed Care – PPO | Admitting: *Deleted

## 2014-02-27 DIAGNOSIS — Z3009 Encounter for other general counseling and advice on contraception: Secondary | ICD-10-CM

## 2014-02-27 DIAGNOSIS — Z30013 Encounter for initial prescription of injectable contraceptive: Secondary | ICD-10-CM

## 2014-02-27 LAB — HCG, SERUM, QUALITATIVE: HCG, BETA SUBUNIT, QUAL, SERUM: NEGATIVE m[IU]/mL (ref <6)

## 2014-02-27 MED ORDER — MEDROXYPROGESTERONE ACETATE 150 MG/ML IM SUSP
150.0000 mg | INTRAMUSCULAR | Status: AC
  Administered 2014-02-27: 150 mg via INTRAMUSCULAR

## 2014-02-27 NOTE — Patient Instructions (Signed)

## 2014-02-27 NOTE — Progress Notes (Signed)
Medroxyprogesterone given and tolerated well

## 2014-03-29 ENCOUNTER — Ambulatory Visit (INDEPENDENT_AMBULATORY_CARE_PROVIDER_SITE_OTHER): Payer: BC Managed Care – PPO | Admitting: Nurse Practitioner

## 2014-03-29 ENCOUNTER — Encounter: Payer: Self-pay | Admitting: Nurse Practitioner

## 2014-03-29 VITALS — BP 115/78 | HR 91 | Temp 97.7°F | Ht 67.0 in | Wt 178.0 lb

## 2014-03-29 DIAGNOSIS — F909 Attention-deficit hyperactivity disorder, unspecified type: Secondary | ICD-10-CM

## 2014-03-29 DIAGNOSIS — F908 Attention-deficit hyperactivity disorder, other type: Secondary | ICD-10-CM

## 2014-03-29 MED ORDER — AMPHETAMINE-DEXTROAMPHET ER 30 MG PO CP24: 30.0000 mg | ORAL_CAPSULE | ORAL | Status: DC

## 2014-03-29 NOTE — Progress Notes (Signed)
   Subjective:    Patient ID: LEITH HEDLUND, female    DOB: 1976-01-25, 38 y.o.   MRN: [id number]  HPI Patient in for follow up of adult ADHD- she is currently on Aaderall XR 30mg  daily- SHe says it that is working well to help her to concentrate.    Review of Systems  Constitutional: Negative.   HENT: Negative.   Respiratory: Negative.   Cardiovascular: Negative.   Genitourinary: Negative.   Neurological: Negative.   Psychiatric/Behavioral: Negative.   All other systems reviewed and are negative.      Objective:   Physical Exam  Constitutional: She is oriented to person, place, and time. She appears well-developed and well-nourished.  Cardiovascular: Normal rate, regular rhythm and normal heart sounds.   Pulmonary/Chest: Effort normal and breath sounds normal.  Neurological: She is alert and oriented to person, place, and time.  Skin: Skin is warm and dry.  Psychiatric: She has a normal mood and affect. Her behavior is normal. Judgment and thought content normal.   BP 115/78  Pulse 91  Temp(Src) 97.7 F (36.5 C) (Oral)  Ht 5\' 7"  (1.702 m)  Wt 178 lb (80.74 kg)  BMI 27.87 kg/m2        Assessment & Plan:   1. ADHD, adult residual type    Meds ordered this encounter  Medications  . amphetamine-dextroamphetamine (ADDERALL XR) 30 MG 24 hr capsule    Sig: Take 1 capsule (30 mg total) by mouth every morning.    Dispense:  30 capsule    Refill:  0    DO  NOT FILL TILL 04/06/14    Order Specific Question:  Supervising Provider    Answer:  Chipper Herb [[id number]]  . amphetamine-dextroamphetamine (ADDERALL XR) 30 MG 24 hr capsule    Sig: Take 1 capsule (30 mg total) by mouth every morning.    Dispense:  30 capsule    Refill:  0    DO NOT FILL TILL 04/27/14    Order Specific Question:  Supervising Provider    Answer:  Chipper Herb [[id number]]   Patient is to schedule CPE to get PAP and labs done  Mary-Margaret Hassell Done, FNP

## 2014-03-29 NOTE — Patient Instructions (Signed)

## 2014-05-30 ENCOUNTER — Ambulatory Visit (INDEPENDENT_AMBULATORY_CARE_PROVIDER_SITE_OTHER): Payer: BC Managed Care – PPO | Admitting: *Deleted

## 2014-05-30 ENCOUNTER — Other Ambulatory Visit: Payer: Self-pay | Admitting: Nurse Practitioner

## 2014-05-30 ENCOUNTER — Other Ambulatory Visit: Payer: Self-pay | Admitting: Family Medicine

## 2014-05-30 ENCOUNTER — Other Ambulatory Visit: Payer: Self-pay | Admitting: *Deleted

## 2014-05-30 DIAGNOSIS — F908 Attention-deficit hyperactivity disorder, other type: Secondary | ICD-10-CM

## 2014-05-30 DIAGNOSIS — Z30013 Encounter for initial prescription of injectable contraceptive: Secondary | ICD-10-CM

## 2014-05-30 DIAGNOSIS — Z308 Encounter for other contraceptive management: Secondary | ICD-10-CM

## 2014-05-30 MED ORDER — MEDROXYPROGESTERONE ACETATE 150 MG/ML IM SUSP
150.0000 mg | INTRAMUSCULAR | Status: AC
  Administered 2014-05-30: 150 mg via INTRAMUSCULAR

## 2014-05-30 MED ORDER — AMPHETAMINE-DEXTROAMPHET ER 30 MG PO CP24: 30.0000 mg | ORAL_CAPSULE | ORAL | Status: DC

## 2014-05-30 NOTE — Progress Notes (Signed)
Patient ID: Stephanie Buckley, female   DOB: March 20, 1976, 38 y.o.   MRN: [id number] Depo provera given and tolerated well

## 2014-05-30 NOTE — Patient Instructions (Signed)

## 2014-06-18 ENCOUNTER — Other Ambulatory Visit: Payer: BC Managed Care – PPO | Admitting: Nurse Practitioner

## 2014-07-31 ENCOUNTER — Other Ambulatory Visit: Payer: Self-pay | Admitting: *Deleted

## 2014-07-31 ENCOUNTER — Other Ambulatory Visit: Payer: Self-pay | Admitting: Nurse Practitioner

## 2014-07-31 DIAGNOSIS — F908 Attention-deficit hyperactivity disorder, other type: Secondary | ICD-10-CM

## 2014-07-31 MED ORDER — AMPHETAMINE-DEXTROAMPHET ER 30 MG PO CP24: 30.0000 mg | ORAL_CAPSULE | ORAL | Status: DC

## 2014-07-31 NOTE — Telephone Encounter (Signed)
Lm, script for adderall ready.

## 2014-07-31 NOTE — Telephone Encounter (Signed)
adderall rx ready for pick up  

## 2014-09-02 ENCOUNTER — Ambulatory Visit (INDEPENDENT_AMBULATORY_CARE_PROVIDER_SITE_OTHER): Payer: BC Managed Care – PPO | Admitting: Nurse Practitioner

## 2014-09-02 ENCOUNTER — Encounter: Payer: Self-pay | Admitting: Nurse Practitioner

## 2014-09-02 VITALS — BP 115/76 | HR 95 | Temp 96.5°F | Ht 67.0 in | Wt 194.0 lb

## 2014-09-02 DIAGNOSIS — Z309 Encounter for contraceptive management, unspecified: Secondary | ICD-10-CM

## 2014-09-02 DIAGNOSIS — Z3009 Encounter for other general counseling and advice on contraception: Secondary | ICD-10-CM

## 2014-09-02 DIAGNOSIS — F908 Attention-deficit hyperactivity disorder, other type: Secondary | ICD-10-CM

## 2014-09-02 DIAGNOSIS — Z01419 Encounter for gynecological examination (general) (routine) without abnormal findings: Secondary | ICD-10-CM

## 2014-09-02 DIAGNOSIS — Z0000 Encounter for general adult medical examination without abnormal findings: Secondary | ICD-10-CM

## 2014-09-02 DIAGNOSIS — F900 Attention-deficit hyperactivity disorder, predominantly inattentive type: Secondary | ICD-10-CM

## 2014-09-02 LAB — POCT URINALYSIS DIPSTICK
BILIRUBIN UA: NEGATIVE
Glucose, UA: NEGATIVE
Ketones, UA: NEGATIVE
NITRITE UA: NEGATIVE
PH UA: 7
Protein, UA: NEGATIVE
RBC UA: NEGATIVE
Spec Grav, UA: 1.01
Urobilinogen, UA: NEGATIVE

## 2014-09-02 LAB — POCT CBC
GRANULOCYTE PERCENT: 67.7 % (ref 37–80)
HEMATOCRIT: 42.9 % (ref 37.7–47.9)
Hemoglobin: 14.1 g/dL (ref 12.2–16.2)
Lymph, poc: 2.5 (ref 0.6–3.4)
MCH: 30.2 pg (ref 27–31.2)
MCHC: 33 g/dL (ref 31.8–35.4)
MCV: 91.5 fL (ref 80–97)
MPV: 7.5 fL (ref 0–99.8)
POC Granulocyte: 6.6 (ref 2–6.9)
POC LYMPH %: 25.5 % (ref 10–50)
Platelet Count, POC: 319 10*3/uL (ref 142–424)
RBC: 4.7 M/uL (ref 4.04–5.48)
RDW, POC: 13 %
WBC: 9.8 10*3/uL (ref 4.6–10.2)

## 2014-09-02 LAB — POCT UA - MICROSCOPIC ONLY
Bacteria, U Microscopic: NEGATIVE
CASTS, UR, LPF, POC: NEGATIVE
CRYSTALS, UR, HPF, POC: NEGATIVE
Mucus, UA: NEGATIVE
RBC, urine, microscopic: NEGATIVE
YEAST UA: NEGATIVE

## 2014-09-02 MED ORDER — AMPHETAMINE-DEXTROAMPHET ER 30 MG PO CP24: 30.0000 mg | ORAL_CAPSULE | ORAL | Status: DC

## 2014-09-02 MED ORDER — NORGESTIM-ETH ESTRAD TRIPHASIC 0.18/0.215/0.25 MG-25 MCG PO TABS: 1.0000 | ORAL_TABLET | Freq: Every day | ORAL | Status: DC

## 2014-09-02 NOTE — Progress Notes (Signed)
Subjective:    Patient ID: Stephanie Buckley, female    DOB: [birth year], 39 y.o.   MRN: [id number]  HPI Patien there today for annual physical exam and PAP- She is doing well today without complaints- Her only medical problem is adult ADD- currently on adderall XR 7m dialy- doing quite well on meds without problems. - she was on deop shot and wants to go back on pills- last depo show was over 3 months ago. LMP 08/06/14.    Review of Systems  Constitutional: Negative.   HENT: Negative.   Respiratory: Negative.   Cardiovascular: Negative.   Gastrointestinal: Negative.   Genitourinary: Negative.   Neurological: Negative.   Psychiatric/Behavioral: Negative.   All other systems reviewed and are negative.      Objective:   Physical Exam  Constitutional: She is oriented to person, place, and time. She appears well-developed and well-nourished.  HENT:  Head: Normocephalic.  Right Ear: Hearing, tympanic membrane, external ear and ear canal normal.  Left Ear: Hearing, tympanic membrane, external ear and ear canal normal.  Nose: Nose normal.  Mouth/Throat: Uvula is midline and oropharynx is clear and moist.  Eyes: Conjunctivae and EOM are normal. Pupils are equal, round, and reactive to light.  Neck: Normal range of motion and full passive range of motion without pain. Neck supple. No JVD present. Carotid bruit is not present. No thyroid mass and no thyromegaly present.  Cardiovascular: Normal rate, normal heart sounds and intact distal pulses.   No murmur heard. Pulmonary/Chest: Effort normal and breath sounds normal. Right breast exhibits no inverted nipple, no mass, no nipple discharge, no skin change and no tenderness. Left breast exhibits no inverted nipple, no mass, no nipple discharge, no skin change and no tenderness.  Abdominal: Soft. Bowel sounds are normal. She exhibits no mass. There is no tenderness.  Genitourinary: Vagina normal and uterus normal. No breast swelling,  tenderness, discharge or bleeding.  bimanual exam-No adnexal masses or tenderness. Cervix parous and pink no discharge   Musculoskeletal: Normal range of motion.  Lymphadenopathy:    She has no cervical adenopathy.  Neurological: She is alert and oriented to person, place, and time.  Skin: Skin is warm and dry.  Psychiatric: She has a normal mood and affect. Her behavior is normal. Judgment and thought content normal.    BP 115/76 mmHg  Pulse 95  Temp(Src) 96.5 F (35.8 C) (Oral)  Ht _0  (1.702 m)  Wt 194 lb (87.998 kg)  BMI 30.38 kg/m2       Assessment & Plan:  1. Annual physical exam - POCT urinalysis dipstick - POCT UA - Microscopic Only - POCT CBC - CMP14+EGFR - NMR, lipoprofile - Thyroid Panel With TSH  2. ADHD, adult residual type - amphetamine-dextroamphetamine (ADDERALL XR) 30 MG 24 hr capsule; Take 1 capsule (30 mg total) by mouth every morning.  Dispense: 30 capsule; Refill: 0 - amphetamine-dextroamphetamine (ADDERALL XR) 30 MG 24 hr capsule; Take 1 capsule (30 mg total) by mouth every morning.  Dispense: 30 capsule; Refill: 0 - amphetamine-dextroamphetamine (ADDERALL XR) 30 MG 24 hr capsule; Take 1 capsule (30 mg total) by mouth every morning.  Dispense: 30 capsule; Refill: 0  3. Birth control counseling Start the Sunday after menses starts - Norgestimate-Ethinyl Estradiol Triphasic (ORTHO TRI-CYCLEN LO) 0.18/0.215/0.25 MG-25 MCG tab; Take 1 tablet by mouth daily.  Dispense: 1 Package; Refill: 11  4. Encounter for routine gynecological examination - Pap IG w/ reflex to HPV when ASC-U  Labs pending Health maintenance reviewed Diet and exercise encouraged Continue all meds Follow up  In 6 months  [place], FNP

## 2014-09-02 NOTE — Patient Instructions (Signed)

## 2014-09-03 LAB — NMR, LIPOPROFILE
Cholesterol: 171 mg/dL (ref 100–199)
HDL Cholesterol by NMR: 52 mg/dL (ref >39)
HDL Particle Number: 37.8 umol/L (ref >=30.5)
LDL Particle Number: 1080 nmol/L — ABNORMAL HIGH (ref <1000)
LDL Size: 21.1 nm (ref >20.5)
LDL-C: 90 mg/dL (ref 0–99)
LP-IR Score: <25 (ref <=45)
Small LDL Particle Number: 561 nmol/L — ABNORMAL HIGH (ref <=527)
Triglycerides by NMR: 143 mg/dL (ref 0–149)

## 2014-09-03 LAB — CMP14+EGFR
A/G RATIO: 2 (ref 1.1–2.5)
ALBUMIN: 4.6 g/dL (ref 3.5–5.5)
ALK PHOS: 91 IU/L (ref 39–117)
ALT: 36 IU/L — ABNORMAL HIGH (ref 0–32)
AST: 22 IU/L (ref 0–40)
BUN / CREAT RATIO: 21 — AB (ref 8–20)
BUN: 16 mg/dL (ref 6–20)
CO2: 22 mmol/L (ref 18–29)
Calcium: 9 mg/dL (ref 8.7–10.2)
Chloride: 101 mmol/L (ref 97–108)
Creatinine, Ser: 0.75 mg/dL (ref 0.57–1.00)
GFR calc non Af Amer: 102 mL/min/{1.73_m2} (ref >59)
GFR, EST AFRICAN AMERICAN: 118 mL/min/{1.73_m2} (ref >59)
Globulin, Total: 2.3 g/dL (ref 1.5–4.5)
Glucose: 82 mg/dL (ref 65–99)
Potassium: 4.7 mmol/L (ref 3.5–5.2)
Sodium: 139 mmol/L (ref 134–144)
TOTAL PROTEIN: 6.9 g/dL (ref 6.0–8.5)

## 2014-09-03 LAB — THYROID PANEL WITH TSH
Free Thyroxine Index: 2.3 (ref 1.2–4.9)
T3 Uptake Ratio: 32 % (ref 24–39)
T4, Total: 7.3 ug/dL (ref 4.5–12.0)
TSH: 1.79 u[IU]/mL (ref 0.450–4.500)

## 2014-09-04 LAB — PAP IG W/ RFLX HPV ASCU: PAP Smear Comment: 0

## 2014-09-05 ENCOUNTER — Telehealth: Payer: Self-pay | Admitting: *Deleted

## 2014-09-05 NOTE — Telephone Encounter (Signed)
-----   Message from Chevis Pretty, [place] sent at 09/05/2014  9:48 AM EST ----- Pap normal- repeat in 2 years

## 2014-09-05 NOTE — Telephone Encounter (Signed)
Patient aware.

## 2014-10-16 ENCOUNTER — Encounter: Payer: Self-pay | Admitting: Family Medicine

## 2014-10-16 ENCOUNTER — Ambulatory Visit (INDEPENDENT_AMBULATORY_CARE_PROVIDER_SITE_OTHER): Payer: BC Managed Care – PPO | Admitting: Family Medicine

## 2014-10-16 VITALS — BP 114/74 | HR 102 | Temp 97.7°F | Ht 67.0 in | Wt 194.2 lb

## 2014-10-16 DIAGNOSIS — J029 Acute pharyngitis, unspecified: Secondary | ICD-10-CM | POA: Diagnosis not present

## 2014-10-16 DIAGNOSIS — J0300 Acute streptococcal tonsillitis, unspecified: Secondary | ICD-10-CM

## 2014-10-16 LAB — POCT RAPID STREP A (OFFICE): Rapid Strep A Screen: POSITIVE — AB

## 2014-10-16 MED ORDER — AMOXICILLIN-POT CLAVULANATE 875-125 MG PO TABS: 1.0000 | ORAL_TABLET | Freq: Two times a day (BID) | ORAL | Status: DC

## 2014-10-16 MED ORDER — TRAMADOL HCL 50 MG PO TABS: 50.0000 mg | ORAL_TABLET | ORAL | Status: DC | PRN

## 2014-10-16 NOTE — Progress Notes (Signed)
Subjective:  Patient ID: Stephanie Buckley, female    DOB: [birth year]  Age: 39 y.o. MRN: [id number]  CC: Sore Throat   HPI Stephanie Buckley presents for sore throat. Son has similar symptoms. Low-grade fever noted. Onset 1 day ago. Increased today. History Stephanie Buckley has a past medical history of ADHD (attention deficit hyperactivity disorder).   She has no past surgical history on file.   Her family history includes Bipolar disorder in her father.She reports that she has been smoking.  She does not have any smokeless tobacco history on file. She reports that she drinks alcohol. She reports that she does not use illicit drugs.  Current Outpatient Prescriptions on File Prior to Visit  Medication Sig Dispense Refill  . amphetamine-dextroamphetamine (ADDERALL XR) 30 MG 24 hr capsule Take 1 capsule (30 mg total) by mouth every morning. 30 capsule 0  . amphetamine-dextroamphetamine (ADDERALL XR) 30 MG 24 hr capsule Take 1 capsule (30 mg total) by mouth every morning. 30 capsule 0  . amphetamine-dextroamphetamine (ADDERALL XR) 30 MG 24 hr capsule Take 1 capsule (30 mg total) by mouth every morning. 30 capsule 0  . Norgestimate-Ethinyl Estradiol Triphasic (ORTHO TRI-CYCLEN LO) 0.18/0.215/0.25 MG-25 MCG tab Take 1 tablet by mouth daily. (Patient not taking: Reported on 10/16/2014) 1 Package 11   Current Facility-Administered Medications on File Prior to Visit  Medication Dose Route Frequency Provider Last Rate Last Dose  . medroxyPROGESTERone (DEPO-PROVERA) injection 150 mg  150 mg Intramuscular Q90 days Mary-Margaret Hassell Done, FNP   150 mg at 02/27/14 1525  . medroxyPROGESTERone (DEPO-PROVERA) injection 150 mg  150 mg Intramuscular Q90 days Mary-Margaret Hassell Done, FNP   150 mg at 05/30/14 1555    ROS Review of Systems  Constitutional: Negative for fever, chills, activity change and appetite change.  HENT: Positive for congestion, sore throat and trouble swallowing. Negative for ear discharge, ear pain,  hearing loss, nosebleeds, postnasal drip, rhinorrhea, sinus pressure and sneezing.   Respiratory: Negative for chest tightness and shortness of breath.   Cardiovascular: Negative for chest pain and palpitations.  Skin: Negative for rash.    Objective:  BP 114/74 mmHg  Pulse 102  Temp(Src) 97.7 F (36.5 C) (Oral)  Ht 5\' 7"  (1.702 m)  Wt 194 lb 3.2 oz (88.089 kg)  BMI 30.41 kg/m2  LMP 09/25/2014  BP Readings from Last 3 Encounters:  10/16/14 114/74  09/02/14 115/76  03/29/14 115/78    Wt Readings from Last 3 Encounters:  10/16/14 194 lb 3.2 oz (88.089 kg)  09/02/14 194 lb (87.998 kg)  03/29/14 178 lb (80.74 kg)     Physical Exam  Constitutional: She appears well-developed and well-nourished.  HENT:  Head: Normocephalic and atraumatic.  Right Ear: Tympanic membrane and external ear normal. No decreased hearing is noted.  Left Ear: Tympanic membrane and external ear normal. No decreased hearing is noted.  Nose: Mucosal edema present. Right sinus exhibits no frontal sinus tenderness. Left sinus exhibits no frontal sinus tenderness.  Mouth/Throat: Oropharyngeal exudate present. No posterior oropharyngeal erythema.  Neck: No Brudzinski's sign noted.  Pulmonary/Chest: Breath sounds normal. No respiratory distress.  Lymphadenopathy:       Head (right side): No preauricular adenopathy present.       Head (left side): No preauricular adenopathy present.       Right cervical: No superficial cervical adenopathy present.      Left cervical: No superficial cervical adenopathy present.    No results found for: HGBA1C  Lab Results  Component Value  Date   WBC 9.8 09/02/2014   HGB 14.1 09/02/2014   HCT 42.9 09/02/2014   GLUCOSE 82 09/02/2014   CHOL 171 09/02/2014   TRIG 143 09/02/2014   HDL 52 09/02/2014   ALT 36* 09/02/2014   AST 22 09/02/2014   NA 139 09/02/2014   K 4.7 09/02/2014   CL 101 09/02/2014   CREATININE 0.75 09/02/2014   BUN 16 09/02/2014   CO2 22 09/02/2014     TSH 1.790 09/02/2014    Patient was never admitted.  Assessment & Plan:   Stephanie Buckley was seen today for sore throat.  Diagnoses and all orders for this visit:  Sore throat Orders: -     POCT rapid strep A  Strep tonsillitis  Other orders -     amoxicillin-clavulanate (AUGMENTIN) 875-125 MG per tablet; Take 1 tablet by mouth 2 (two) times daily. Take all of this medication -     traMADol (ULTRAM) 50 MG tablet; Take 1 tablet (50 mg total) by mouth every 4 (four) hours as needed for moderate pain.   I am having Stephanie Buckley start on amoxicillin-clavulanate and traMADol. I am also having her maintain her amphetamine-dextroamphetamine, amphetamine-dextroamphetamine, amphetamine-dextroamphetamine, and Norgestimate-Ethinyl Estradiol Triphasic. We will continue to administer medroxyPROGESTERone and medroxyPROGESTERone.  Meds ordered this encounter  Medications  . amoxicillin-clavulanate (AUGMENTIN) 875-125 MG per tablet    Sig: Take 1 tablet by mouth 2 (two) times daily. Take all of this medication    Dispense:  20 tablet    Refill:  0  . traMADol (ULTRAM) 50 MG tablet    Sig: Take 1 tablet (50 mg total) by mouth every 4 (four) hours as needed for moderate pain.    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  Claretta Fraise, M.D.

## 2014-12-02 ENCOUNTER — Other Ambulatory Visit: Payer: Self-pay | Admitting: Nurse Practitioner

## 2014-12-02 DIAGNOSIS — F908 Attention-deficit hyperactivity disorder, other type: Secondary | ICD-10-CM

## 2014-12-02 MED ORDER — AMPHETAMINE-DEXTROAMPHET ER 30 MG PO CP24: 30.0000 mg | ORAL_CAPSULE | ORAL | Status: DC

## 2014-12-02 NOTE — Telephone Encounter (Signed)
Left detailed message on patients voicemail that rx up front and ready to pick up

## 2014-12-02 NOTE — Telephone Encounter (Signed)
adderall rx ready for pick up

## 2014-12-25 ENCOUNTER — Ambulatory Visit (INDEPENDENT_AMBULATORY_CARE_PROVIDER_SITE_OTHER): Payer: BC Managed Care – PPO | Admitting: Nurse Practitioner

## 2014-12-25 ENCOUNTER — Encounter: Payer: Self-pay | Admitting: Nurse Practitioner

## 2014-12-25 VITALS — BP 123/79 | HR 84 | Temp 96.7°F | Ht 67.0 in | Wt 179.0 lb

## 2014-12-25 DIAGNOSIS — F900 Attention-deficit hyperactivity disorder, predominantly inattentive type: Secondary | ICD-10-CM

## 2014-12-25 DIAGNOSIS — F908 Attention-deficit hyperactivity disorder, other type: Secondary | ICD-10-CM

## 2014-12-25 MED ORDER — AMPHETAMINE-DEXTROAMPHET ER 20 MG PO CP24: 40.0000 mg | ORAL_CAPSULE | Freq: Every day | ORAL | Status: DC

## 2014-12-25 NOTE — Progress Notes (Signed)
   Subjective:    Patient ID: Stephanie Buckley, female    DOB: 1975-12-27, 39 y.o.   MRN: [id number]  HPI Patient here today for ADHD follow up. Currently on adderall XR 30 mg daily. This dose is not working as good as it was. Would like to increase dose.  Keeps her calm and concentrating on her work. Has no side effects from medication.    Review of Systems  Constitutional: Negative.   HENT: Negative.   Respiratory: Negative.   Cardiovascular: Negative.   Genitourinary: Negative.   Neurological: Negative.   Psychiatric/Behavioral: Negative.   All other systems reviewed and are negative.      Objective:   Physical Exam  Constitutional: She appears well-developed and well-nourished.  Cardiovascular: Normal rate, regular rhythm and normal heart sounds.   Pulmonary/Chest: Effort normal and breath sounds normal.  Skin: Skin is warm and dry.  Psychiatric: She has a normal mood and affect. Her behavior is normal. Judgment and thought content normal.   BP 123/79 mmHg  Pulse 84  Temp(Src) 96.7 F (35.9 C) (Oral)  Ht 5\' 7"  (1.702 m)  Wt 179 lb (81.194 kg)  BMI 28.03 kg/m2        Assessment & Plan:  1. ADHD, adult residual type Stress management Increased dose of adderall XR from 30mg  daily to 40mg  daily - amphetamine-dextroamphetamine (ADDERALL XR) 20 MG 24 hr capsule; Take 2 capsules (40 mg total) by mouth daily.  Dispense: 60 capsule; Refill: 0 - amphetamine-dextroamphetamine (ADDERALL XR) 20 MG 24 hr capsule; Take 2 capsules (40 mg total) by mouth daily.  Dispense: 60 capsule; Refill: 0 - amphetamine-dextroamphetamine (ADDERALL XR) 20 MG 24 hr capsule; Take 2 capsules (40 mg total) by mouth daily.  Dispense: 60 capsule; Refill: 0   Mary-Margaret Hassell Done, FNP

## 2014-12-25 NOTE — Patient Instructions (Signed)

## 2015-03-20 ENCOUNTER — Telehealth: Payer: Self-pay | Admitting: Nurse Practitioner

## 2015-03-20 DIAGNOSIS — F908 Attention-deficit hyperactivity disorder, other type: Secondary | ICD-10-CM

## 2015-03-20 MED ORDER — AMPHETAMINE-DEXTROAMPHET ER 20 MG PO CP24: 40.0000 mg | ORAL_CAPSULE | Freq: Every day | ORAL | Status: DC

## 2015-03-20 NOTE — Telephone Encounter (Signed)
Patient notified that rx up front and ready for pick up 

## 2015-03-20 NOTE — Telephone Encounter (Signed)
adderall rx ready for pick up

## 2015-04-10 ENCOUNTER — Encounter: Payer: Self-pay | Admitting: Nurse Practitioner

## 2015-04-10 ENCOUNTER — Ambulatory Visit (INDEPENDENT_AMBULATORY_CARE_PROVIDER_SITE_OTHER): Payer: BC Managed Care – PPO | Admitting: Nurse Practitioner

## 2015-04-10 VITALS — BP 114/81 | HR 85 | Temp 97.0°F | Ht 67.0 in | Wt 188.0 lb

## 2015-04-10 DIAGNOSIS — F900 Attention-deficit hyperactivity disorder, predominantly inattentive type: Secondary | ICD-10-CM

## 2015-04-10 DIAGNOSIS — F908 Attention-deficit hyperactivity disorder, other type: Secondary | ICD-10-CM

## 2015-04-10 MED ORDER — AMPHETAMINE-DEXTROAMPHET ER 20 MG PO CP24: 40.0000 mg | ORAL_CAPSULE | Freq: Every day | ORAL | Status: DC

## 2015-04-10 NOTE — Patient Instructions (Signed)
Stress and Stress Management Stress is a normal reaction to life events. It is what you feel when life demands more than you are used to or more than you can handle. Some stress can be useful. For example, the stress reaction can help you catch the last bus of the day, study for a test, or meet a deadline at work. But stress that occurs too often or for too long can cause problems. It can affect your emotional health and interfere with relationships and normal daily activities. Too much stress can weaken your immune system and increase your risk for physical illness. If you already have a medical problem, stress can make it worse. CAUSES  All sorts of life events may cause stress. An event that causes stress for one person may not be stressful for another person. Major life events commonly cause stress. These may be positive or negative. Examples include losing your job, moving into a new home, getting married, having a baby, or losing a loved one. Less obvious life events may also cause stress, especially if they occur day after day or in combination. Examples include working long hours, driving in traffic, caring for children, being in debt, or being in a difficult relationship. SIGNS AND SYMPTOMS Stress may cause emotional symptoms including, the following:  Anxiety. This is feeling worried, afraid, on edge, overwhelmed, or out of control.  Anger. This is feeling irritated or impatient.  Depression. This is feeling sad, down, helpless, or guilty.  Difficulty focusing, remembering, or making decisions. Stress may cause physical symptoms, including the following:   Aches and pains. These may affect your head, neck, back, stomach, or other areas of your body.  Tight muscles or clenched jaw.  Low energy or trouble sleeping. Stress may cause unhealthy behaviors, including the following:   Eating to feel better (overeating) or skipping meals.  Sleeping too little, too much, or both.  Working  too much or putting off tasks (procrastination).  Smoking, drinking alcohol, or using drugs to feel better. DIAGNOSIS  Stress is diagnosed through an assessment by your health care provider. Your health care provider will ask questions about your symptoms and any stressful life events.Your health care provider will also ask about your medical history and may order blood tests or other tests. Certain medical conditions and medicine can cause physical symptoms similar to stress. Mental illness can cause emotional symptoms and unhealthy behaviors similar to stress. Your health care provider may refer you to a mental health professional for further evaluation.  TREATMENT  Stress management is the recommended treatment for stress.The goals of stress management are reducing stressful life events and coping with stress in healthy ways.  Techniques for reducing stressful life events include the following:  Stress identification. Self-monitor for stress and identify what causes stress for you. These skills may help you to avoid some stressful events.  Time management. Set your priorities, keep a calendar of events, and learn to say "no." These tools can help you avoid making too many commitments. Techniques for coping with stress include the following:  Rethinking the problem. Try to think realistically about stressful events rather than ignoring them or overreacting. Try to find the positives in a stressful situation rather than focusing on the negatives.  Exercise. Physical exercise can release both physical and emotional tension. The key is to find a form of exercise you enjoy and do it regularly.  Relaxation techniques. These relax the body and mind. Examples include yoga, meditation, tai chi, biofeedback, deep  breathing, progressive muscle relaxation, listening to music, being out in nature, journaling, and other hobbies. Again, the key is to find one or more that you enjoy and can do  regularly.  Healthy lifestyle. Eat a balanced diet, get plenty of sleep, and do not smoke. Avoid using alcohol or drugs to relax.  Strong support network. Spend time with family, friends, or other people you enjoy being around.Express your feelings and talk things over with someone you trust. Counseling or talktherapy with a mental health professional may be helpful if you are having difficulty managing stress on your own. Medicine is typically not recommended for the treatment of stress.Talk to your health care provider if you think you need medicine for symptoms of stress. HOME CARE INSTRUCTIONS  Keep all follow-up visits as directed by your health care provider.  Take all medicines as directed by your health care provider. SEEK MEDICAL CARE IF:  Your symptoms get worse or you start having new symptoms.  You feel overwhelmed by your problems and can no longer manage them on your own. SEEK IMMEDIATE MEDICAL CARE IF:  You feel like hurting yourself or someone else. Document Released: 02/02/2001 Document Revised: 12/24/2013 Document Reviewed: 04/03/2013 Exit[employer] Patient Information 2015 [employer]. This information is not intended to replace advice given to you by your health care provider. Make sure you discuss any questions you have with your health care provider.  

## 2015-04-10 NOTE — Progress Notes (Signed)
   Subjective:    Patient ID: Stephanie Buckley, female    DOB: 09/19/1975, 39 y.o.   MRN: [id number]  HPI Patient in today for ADHD follow up- She is currently on adderall xr 40mg  daily. SHe is doing well- helps her to concentrate at work- she is a Education officer, [employer] in middle school. SHe denies any side effects from meds.    Review of Systems  Constitutional: Negative.   HENT: Negative.   Respiratory: Negative.   Cardiovascular: Negative.   Genitourinary: Negative.   Neurological: Negative.   Psychiatric/Behavioral: Negative.   All other systems reviewed and are negative.      Objective:   Physical Exam  Constitutional: She is oriented to person, place, and time. She appears well-developed and well-nourished.  Cardiovascular: Normal rate, regular rhythm and normal heart sounds.   Pulmonary/Chest: Effort normal and breath sounds normal.  Neurological: She is alert and oriented to person, place, and time.  Skin: Skin is warm and dry.  Psychiatric: She has a normal mood and affect. Her behavior is normal. Judgment and thought content normal.   BP 114/81 mmHg  Pulse 85  Temp(Src) 97 F (36.1 C) (Oral)  Ht 5\' 7"  (1.702 m)  Wt 188 lb (85.276 kg)  BMI 29.44 kg/m2  LMP 04/10/2015      Assessment & Plan:   1. ADHD, adult residual type    Meds ordered this encounter  Medications  . amphetamine-dextroamphetamine (ADDERALL XR) 20 MG 24 hr capsule    Sig: Take 2 capsules (40 mg total) by mouth daily.    Dispense:  60 capsule    Refill:  0    Do not fill till 06/24/15    Order Specific Question:  Supervising Provider    Answer:  Chipper Herb [[id number]]  . amphetamine-dextroamphetamine (ADDERALL XR) 20 MG 24 hr capsule    Sig: Take 2 capsules (40 mg total) by mouth daily.    Dispense:  60 capsule    Refill:  0    DO NOT FILL TILL 05/24/15    Order Specific Question:  Supervising Provider    Answer:  Chipper Herb [[id number]]  . amphetamine-dextroamphetamine (ADDERALL XR) 20 MG 24 hr  capsule    Sig: Take 2 capsules (40 mg total) by mouth daily.    Dispense:  60 capsule    Refill:  0    DO NOT FILL TILL 04/24/15    Order Specific Question:  Supervising Provider    Answer:  Chipper Herb [[id number]]   Stress management Follow up in 3 months  Mary-Margaret Hassell Done, FNP

## 2015-04-23 ENCOUNTER — Other Ambulatory Visit: Payer: Self-pay | Admitting: Nurse Practitioner

## 2015-05-06 ENCOUNTER — Ambulatory Visit: Payer: BC Managed Care – PPO

## 2015-05-06 ENCOUNTER — Ambulatory Visit (INDEPENDENT_AMBULATORY_CARE_PROVIDER_SITE_OTHER): Payer: BC Managed Care – PPO | Admitting: Nurse Practitioner

## 2015-05-06 ENCOUNTER — Encounter: Payer: Self-pay | Admitting: Nurse Practitioner

## 2015-05-06 VITALS — BP 126/82 | HR 80 | Temp 98.4°F | Ht 67.0 in | Wt 180.0 lb

## 2015-05-06 DIAGNOSIS — N3001 Acute cystitis with hematuria: Secondary | ICD-10-CM | POA: Diagnosis not present

## 2015-05-06 DIAGNOSIS — R300 Dysuria: Secondary | ICD-10-CM

## 2015-05-06 LAB — POCT URINALYSIS DIPSTICK
BILIRUBIN UA: NEGATIVE
Glucose, UA: NEGATIVE
Ketones, UA: NEGATIVE
NITRITE UA: NEGATIVE
PH UA: 6
Spec Grav, UA: <=1.005
UROBILINOGEN UA: NEGATIVE

## 2015-05-06 LAB — POCT UA - MICROSCOPIC ONLY
Casts, Ur, LPF, POC: NEGATIVE
Crystals, Ur, HPF, POC: NEGATIVE
MUCUS UA: NEGATIVE
YEAST UA: NEGATIVE

## 2015-05-06 MED ORDER — NITROFURANTOIN MONOHYD MACRO 100 MG PO CAPS: 100.0000 mg | ORAL_CAPSULE | Freq: Two times a day (BID) | ORAL | Status: DC

## 2015-05-06 NOTE — Progress Notes (Signed)
   Subjective:    Patient ID: Stephanie Buckley, female    DOB: 26-Jul-1976, 39 y.o.   MRN: [id number]  HPI Patient in today c/o dysuria and frequency that started early this morning.    Review of Systems  Constitutional: Negative.   HENT: Negative.   Respiratory: Negative.   Cardiovascular: Negative.   Gastrointestinal: Negative.   Genitourinary: Negative.   Musculoskeletal: Negative.   Neurological: Negative.   Psychiatric/Behavioral: Negative.   All other systems reviewed and are negative.      Objective:   Physical Exam  Constitutional: She is oriented to person, place, and time. She appears well-developed and well-nourished.  Cardiovascular: Normal rate, regular rhythm and normal heart sounds.   Pulmonary/Chest: Effort normal and breath sounds normal.  Abdominal: There is tenderness (suprapuic pain on palpation).  Genitourinary:  No CVA tenderness  Neurological: She is alert and oriented to person, place, and time.  Skin: Skin is warm.  Psychiatric: She has a normal mood and affect. Her behavior is normal. Judgment and thought content normal.   BP 126/82 mmHg  Pulse 80  Temp(Src) 98.4 F (36.9 C) (Oral)  Ht 5\' 7"  (1.702 m)  Wt 180 lb (81.647 kg)  BMI 28.19 kg/m2  LMP 04/10/2015  Results for orders placed or performed in visit on 05/06/15  POCT UA - Microscopic Only  Result Value Ref Range   WBC, Ur, HPF, POC 5-10    RBC, urine, microscopic TNTC    Bacteria, U Microscopic OCC    Mucus, UA NEG    Epithelial cells, urine per micros OCC    Crystals, Ur, HPF, POC NEG    Casts, Ur, LPF, POC NEG    Yeast, UA NEG   POCT urinalysis dipstick  Result Value Ref Range   Color, UA ORANGE    Clarity, UA CLEAR    Glucose, UA NEG    Bilirubin, UA NEG    Ketones, UA NEG    Spec Grav, UA <=1.005    Blood, UA LARGE    pH, UA 6.0    Protein, UA 2+    Urobilinogen, UA negative    Nitrite, UA NEG    Leukocytes, UA large (3+) (A) Negative          Assessment & Plan:    1. Dysuria - POCT UA - Microscopic Only - POCT urinalysis dipstick  2. Acute cystitis with hematuria Take medication as prescribe Cotton underwear Take shower not bath Cranberry juice, yogurt Force fluids AZO over the counter X2 days Culture pending RTO prn   Meds ordered this encounter  Medications  . nitrofurantoin, macrocrystal-monohydrate, (MACROBID) 100 MG capsule    Sig: Take 1 capsule (100 mg total) by mouth 2 (two) times daily. 1 po BId    Dispense:  14 capsule    Refill:  0    Order Specific Question:  Supervising Provider    Answer:  Chipper Herb [[id number]]   Mary-Margaret Hassell Done, FNP

## 2015-05-06 NOTE — Patient Instructions (Signed)

## 2015-05-08 ENCOUNTER — Ambulatory Visit: Payer: BC Managed Care – PPO | Admitting: Family

## 2015-07-21 ENCOUNTER — Encounter: Payer: Self-pay | Admitting: Nurse Practitioner

## 2015-07-21 ENCOUNTER — Ambulatory Visit (INDEPENDENT_AMBULATORY_CARE_PROVIDER_SITE_OTHER): Payer: BC Managed Care – PPO | Admitting: Nurse Practitioner

## 2015-07-21 VITALS — BP 126/85 | HR 86 | Temp 97.2°F | Ht 67.0 in | Wt 196.0 lb

## 2015-07-21 DIAGNOSIS — F908 Attention-deficit hyperactivity disorder, other type: Secondary | ICD-10-CM

## 2015-07-21 DIAGNOSIS — F900 Attention-deficit hyperactivity disorder, predominantly inattentive type: Secondary | ICD-10-CM

## 2015-07-21 MED ORDER — AMPHETAMINE-DEXTROAMPHET ER 20 MG PO CP24: 40.0000 mg | ORAL_CAPSULE | Freq: Every day | ORAL | Status: DC

## 2015-07-21 NOTE — Progress Notes (Signed)
   Subjective:    Patient ID: Stephanie Buckley, female    DOB: [birth year], 39 y.o.   MRN: [id number]  HPI Patient here today for adult ADHD follow up - She is currently on adderall xr 20 mg daily. She is doing well without side effects. SHe says that when she does not take that she cannot concentrate and get her work done.    Review of Systems  Constitutional: Negative.   Respiratory: Negative.   Cardiovascular: Negative.   Gastrointestinal: Negative.   Genitourinary: Negative.   Musculoskeletal: Negative.   Psychiatric/Behavioral: Negative.   All other systems reviewed and are negative.      Objective:   Physical Exam  Constitutional: She is oriented to person, place, and time. She appears well-developed and well-nourished.  Cardiovascular: Normal rate, regular rhythm and normal heart sounds.   Neurological: She is alert and oriented to person, place, and time.  Skin: Skin is warm and dry.  Psychiatric: She has a normal mood and affect. Her behavior is normal. Judgment and thought content normal.    BP 126/85 mmHg  Pulse 86  Temp(Src) 97.2 F (36.2 C) (Oral)  Ht 5\' 7"  (1.702 m)  Wt 196 lb (88.905 kg)  BMI 30.69 kg/m2       Assessment & Plan:  1. ADHD, adult residual type Stress management - amphetamine-dextroamphetamine (ADDERALL XR) 20 MG 24 hr capsule; Take 2 capsules (40 mg total) by mouth daily.  Dispense: 60 capsule; Refill: 0 - amphetamine-dextroamphetamine (ADDERALL XR) 20 MG 24 hr capsule; Take 2 capsules (40 mg total) by mouth daily.  Dispense: 60 capsule; Refill: 0 - amphetamine-dextroamphetamine (ADDERALL XR) 20 MG 24 hr capsule; Take 2 capsules (40 mg total) by mouth daily.  Dispense: 60 capsule; Refill: 0  Mary-Margaret Hassell Done, FNP

## 2015-07-21 NOTE — Patient Instructions (Signed)
Stress and Stress Management Stress is a normal reaction to life events. It is what you feel when life demands more than you are used to or more than you can handle. Some stress can be useful. For example, the stress reaction can help you catch the last bus of the day, study for a test, or meet a deadline at work. But stress that occurs too often or for too long can cause problems. It can affect your emotional health and interfere with relationships and normal daily activities. Too much stress can weaken your immune system and increase your risk for physical illness. If you already have a medical problem, stress can make it worse. CAUSES  All sorts of life events may cause stress. An event that causes stress for one person may not be stressful for another person. Major life events commonly cause stress. These may be positive or negative. Examples include losing your job, moving into a new home, getting married, having a baby, or losing a loved one. Less obvious life events may also cause stress, especially if they occur day after day or in combination. Examples include working long hours, driving in traffic, caring for children, being in debt, or being in a difficult relationship. SIGNS AND SYMPTOMS Stress may cause emotional symptoms including, the following:  Anxiety. This is feeling worried, afraid, on edge, overwhelmed, or out of control.  Anger. This is feeling irritated or impatient.  Depression. This is feeling sad, down, helpless, or guilty.  Difficulty focusing, remembering, or making decisions. Stress may cause physical symptoms, including the following:   Aches and pains. These may affect your head, neck, back, stomach, or other areas of your body.  Tight muscles or clenched jaw.  Low energy or trouble sleeping. Stress may cause unhealthy behaviors, including the following:   Eating to feel better (overeating) or skipping meals.  Sleeping too little, too much, or both.  Working  too much or putting off tasks (procrastination).  Smoking, drinking alcohol, or using drugs to feel better. DIAGNOSIS  Stress is diagnosed through an assessment by your health care provider. Your health care provider will ask questions about your symptoms and any stressful life events.Your health care provider will also ask about your medical history and may order blood tests or other tests. Certain medical conditions and medicine can cause physical symptoms similar to stress. Mental illness can cause emotional symptoms and unhealthy behaviors similar to stress. Your health care provider may refer you to a mental health professional for further evaluation.  TREATMENT  Stress management is the recommended treatment for stress.The goals of stress management are reducing stressful life events and coping with stress in healthy ways.  Techniques for reducing stressful life events include the following:  Stress identification. Self-monitor for stress and identify what causes stress for you. These skills may help you to avoid some stressful events.  Time management. Set your priorities, keep a calendar of events, and learn to say "no." These tools can help you avoid making too many commitments. Techniques for coping with stress include the following:  Rethinking the problem. Try to think realistically about stressful events rather than ignoring them or overreacting. Try to find the positives in a stressful situation rather than focusing on the negatives.  Exercise. Physical exercise can release both physical and emotional tension. The key is to find a form of exercise you enjoy and do it regularly.  Relaxation techniques. These relax the body and mind. Examples include yoga, meditation, tai chi, biofeedback, deep  breathing, progressive muscle relaxation, listening to music, being out in nature, journaling, and other hobbies. Again, the key is to find one or more that you enjoy and can do  regularly.  Healthy lifestyle. Eat a balanced diet, get plenty of sleep, and do not smoke. Avoid using alcohol or drugs to relax.  Strong support network. Spend time with family, friends, or other people you enjoy being around.Express your feelings and talk things over with someone you trust. Counseling or talktherapy with a mental health professional may be helpful if you are having difficulty managing stress on your own. Medicine is typically not recommended for the treatment of stress.Talk to your health care provider if you think you need medicine for symptoms of stress. HOME CARE INSTRUCTIONS  Keep all follow-up visits as directed by your health care provider.  Take all medicines as directed by your health care provider. SEEK MEDICAL CARE IF:  Your symptoms get worse or you start having new symptoms.  You feel overwhelmed by your problems and can no longer manage them on your own. SEEK IMMEDIATE MEDICAL CARE IF:  You feel like hurting yourself or someone else.   This information is not intended to replace advice given to you by your health care provider. Make sure you discuss any questions you have with your health care provider.   Document Released: 02/02/2001 Document Revised: 08/30/2014 Document Reviewed: 04/03/2013 Elsevier Interactive Patient Education 2016 [employer]  

## 2015-08-21 ENCOUNTER — Other Ambulatory Visit: Payer: Self-pay | Admitting: Nurse Practitioner

## 2015-10-17 ENCOUNTER — Telehealth: Payer: Self-pay | Admitting: Nurse Practitioner

## 2015-10-17 ENCOUNTER — Ambulatory Visit (INDEPENDENT_AMBULATORY_CARE_PROVIDER_SITE_OTHER): Payer: BC Managed Care – PPO | Admitting: Nurse Practitioner

## 2015-10-17 ENCOUNTER — Encounter: Payer: Self-pay | Admitting: Nurse Practitioner

## 2015-10-17 VITALS — BP 116/81 | HR 79 | Temp 97.5°F | Ht 67.0 in | Wt 202.6 lb

## 2015-10-17 DIAGNOSIS — F900 Attention-deficit hyperactivity disorder, predominantly inattentive type: Secondary | ICD-10-CM

## 2015-10-17 DIAGNOSIS — F908 Attention-deficit hyperactivity disorder, other type: Secondary | ICD-10-CM

## 2015-10-17 MED ORDER — AMPHETAMINE-DEXTROAMPHET ER 20 MG PO CP24: 40.0000 mg | ORAL_CAPSULE | Freq: Every day | ORAL | Status: DC

## 2015-10-17 NOTE — Telephone Encounter (Signed)
Patient informed that last prescription was filled on 09/20/2015 and that would put her out to 10/20/15 for the next refill.

## 2015-10-17 NOTE — Progress Notes (Signed)
   Subjective:    Patient ID: Stephanie Buckley, female    DOB: 1975/12/24, 40 y.o.   MRN: [id number]  HPI PAtient in today for follow up of adult ADHD- she is currently on adderallxr 20mg  daily- she says it helps her to stay focused at work. SHe denies any side effects.     Review of Systems  Constitutional: Negative.   HENT: Negative.   Respiratory: Negative.   Cardiovascular: Negative.   Genitourinary: Negative.   Neurological: Negative.   Psychiatric/Behavioral: Negative.   All other systems reviewed and are negative.      Objective:   Physical Exam  Constitutional: She is oriented to person, place, and time. She appears well-developed and well-nourished. No distress.  Cardiovascular: Normal rate, regular rhythm and normal heart sounds.   Pulmonary/Chest: Effort normal and breath sounds normal.  Neurological: She is alert and oriented to person, place, and time.  Skin: Skin is warm.  Psychiatric: She has a normal mood and affect. Her behavior is normal. Thought content normal.   BP 116/81 mmHg  Pulse 79  Temp(Src) 97.5 F (36.4 C) (Oral)  Ht 5\' 7"  (1.702 m)  Wt 202 lb 9.6 oz (91.899 kg)  BMI 31.72 kg/m2  LMP 09/17/2015        Assessment & Plan:  1. ADHD, adult residual type Encouraged stress management - amphetamine-dextroamphetamine (ADDERALL XR) 20 MG 24 hr capsule; Take 2 capsules (40 mg total) by mouth daily.  Dispense: 60 capsule; Refill: 0 - amphetamine-dextroamphetamine (ADDERALL XR) 20 MG 24 hr capsule; Take 2 capsules (40 mg total) by mouth daily.  Dispense: 60 capsule; Refill: 0 - amphetamine-dextroamphetamine (ADDERALL XR) 20 MG 24 hr capsule; Take 2 capsules (40 mg total) by mouth daily.  Dispense: 60 capsule; Refill: 0   Mary-Margaret Hassell Done, FNP

## 2015-10-27 ENCOUNTER — Encounter (INDEPENDENT_AMBULATORY_CARE_PROVIDER_SITE_OTHER): Payer: Self-pay

## 2015-10-27 ENCOUNTER — Ambulatory Visit (INDEPENDENT_AMBULATORY_CARE_PROVIDER_SITE_OTHER): Payer: BC Managed Care – PPO

## 2015-10-27 ENCOUNTER — Ambulatory Visit (INDEPENDENT_AMBULATORY_CARE_PROVIDER_SITE_OTHER): Payer: BC Managed Care – PPO | Admitting: Family

## 2015-10-27 ENCOUNTER — Encounter: Payer: Self-pay | Admitting: Family

## 2015-10-27 VITALS — BP 115/85 | HR 103 | Temp 97.1°F | Ht 67.0 in | Wt 195.8 lb

## 2015-10-27 DIAGNOSIS — M79671 Pain in right foot: Secondary | ICD-10-CM | POA: Diagnosis not present

## 2015-10-27 DIAGNOSIS — M722 Plantar fascial fibromatosis: Secondary | ICD-10-CM

## 2015-10-27 MED ORDER — PREDNISONE 10 MG (21) PO TBPK: 10.0000 mg | ORAL_TABLET | Freq: Every day | ORAL | Status: DC

## 2015-10-27 MED ORDER — NAPROXEN 500 MG PO TABS: 500.0000 mg | ORAL_TABLET | Freq: Two times a day (BID) | ORAL | Status: DC

## 2015-10-27 NOTE — Patient Instructions (Addendum)

## 2015-10-27 NOTE — Progress Notes (Addendum)
   Subjective:    Patient ID: Stephanie Buckley, female    DOB: Feb 22, 1976, 40 y.o.   MRN: [id number]  Foot Injury  The incident occurred more than 1 week ago. There was no injury mechanism. The pain is present in the right foot. The quality of the pain is described as aching. The pain is at a severity of 7/10. The pain is moderate. Pertinent negatives include no inability to bear weight, loss of motion, muscle weakness, numbness or tingling. The symptoms are aggravated by weight bearing. She has tried acetaminophen and rest for the symptoms. The treatment provided mild relief.      Review of Systems  Constitutional: Negative.   HENT: Negative.   Eyes: Negative.   Respiratory: Negative.  Negative for shortness of breath.   Cardiovascular: Negative.  Negative for palpitations.  Gastrointestinal: Negative.   Endocrine: Negative.   Genitourinary: Negative.   Musculoskeletal: Negative.   Neurological: Negative.  Negative for tingling, numbness and headaches.  Hematological: Negative.   Psychiatric/Behavioral: Negative.   All other systems reviewed and are negative.      Objective:   Physical Exam  Constitutional: She is oriented to person, place, and time. She appears well-developed and well-nourished. No distress.  Eyes: Pupils are equal, round, and reactive to light.  Neck: Normal range of motion. Neck supple. No thyromegaly present.  Cardiovascular: Normal rate, regular rhythm, normal heart sounds and intact distal pulses.   No murmur heard. Pulmonary/Chest: Effort normal and breath sounds normal. No respiratory distress. She has no wheezes.  Abdominal: Soft. Bowel sounds are normal. She exhibits no distension. There is no tenderness.  Musculoskeletal: Normal range of motion. She exhibits tenderness (mild tenderness on lateral right foot with moderate pressure). She exhibits no edema.  Neurological: She is alert and oriented to person, place, and time.  Skin: Skin is warm and dry.    Psychiatric: She has a normal mood and affect. Her behavior is normal. Judgment and thought content normal.  Vitals reviewed.   BP 115/85 mmHg  Pulse 103  Temp(Src) 97.1 F (36.2 C) (Oral)  Ht 5\' 7"  (1.702 m)  Wt 195 lb 12.8 oz (88.814 kg)  BMI 30.66 kg/m2  LMP 09/17/2015  Right foot x-ray- negative Preliminary reading by Evelina Dun, FNP [facility]      Assessment & Plan:  1. Right foot pain - DG Foot Complete Right; Future - predniSONE (STERAPRED UNI-PAK 21 TAB) 10 MG (21) TBPK tablet; Take 1 tablet (10 mg total) by mouth daily. As directed x 6 days  Dispense: 21 tablet; Refill: 0 - naproxen (NAPROSYN) 500 MG tablet; Take 1 tablet (500 mg total) by mouth 2 (two) times daily with a meal.  Dispense: 60 tablet; Refill: 1  2. Plantar fasciitis of right foot -Rest -Ice- Discussed filling a bottle water and rolling heel to toe 3-4 times a day -ROM and stretches discussed -Good foot support discussed -RTO prn  - predniSONE (STERAPRED UNI-PAK 21 TAB) 10 MG (21) TBPK tablet; Take 1 tablet (10 mg total) by mouth daily. As directed x 6 days  Dispense: 21 tablet; Refill: 0 - naproxen (NAPROSYN) 500 MG tablet; Take 1 tablet (500 mg total) by mouth 2 (two) times daily with a meal.  Dispense: 60 tablet; Refill: [place], FNP

## 2015-11-17 ENCOUNTER — Other Ambulatory Visit: Payer: Self-pay | Admitting: Nurse Practitioner

## 2015-12-18 ENCOUNTER — Telehealth: Payer: Self-pay

## 2015-12-18 NOTE — Telephone Encounter (Signed)
Insurance prior authorized Amphetamine-Dextroamphet ER through 12/17/16

## 2016-01-14 ENCOUNTER — Encounter (INDEPENDENT_AMBULATORY_CARE_PROVIDER_SITE_OTHER): Payer: Self-pay

## 2016-01-15 ENCOUNTER — Ambulatory Visit (INDEPENDENT_AMBULATORY_CARE_PROVIDER_SITE_OTHER): Payer: BC Managed Care – PPO | Admitting: Nurse Practitioner

## 2016-01-15 ENCOUNTER — Encounter: Payer: Self-pay | Admitting: Nurse Practitioner

## 2016-01-15 VITALS — BP 106/76 | HR 89 | Temp 97.3°F | Ht 67.0 in | Wt 196.0 lb

## 2016-01-15 DIAGNOSIS — F900 Attention-deficit hyperactivity disorder, predominantly inattentive type: Secondary | ICD-10-CM | POA: Diagnosis not present

## 2016-01-15 DIAGNOSIS — F908 Attention-deficit hyperactivity disorder, other type: Secondary | ICD-10-CM

## 2016-01-15 MED ORDER — AMPHETAMINE-DEXTROAMPHET ER 20 MG PO CP24: 40.0000 mg | ORAL_CAPSULE | Freq: Every day | ORAL | Status: DC

## 2016-01-15 NOTE — Patient Instructions (Signed)
Stress and Stress Management Stress is a normal reaction to life events. It is what you feel when life demands more than you are used to or more than you can handle. Some stress can be useful. For example, the stress reaction can help you catch the last bus of the day, study for a test, or meet a deadline at work. But stress that occurs too often or for too long can cause problems. It can affect your emotional health and interfere with relationships and normal daily activities. Too much stress can weaken your immune system and increase your risk for physical illness. If you already have a medical problem, stress can make it worse. CAUSES  All sorts of life events may cause stress. An event that causes stress for one person may not be stressful for another person. Major life events commonly cause stress. These may be positive or negative. Examples include losing your job, moving into a new home, getting married, having a baby, or losing a loved one. Less obvious life events may also cause stress, especially if they occur day after day or in combination. Examples include working long hours, driving in traffic, caring for children, being in debt, or being in a difficult relationship. SIGNS AND SYMPTOMS Stress may cause emotional symptoms including, the following:  Anxiety. This is feeling worried, afraid, on edge, overwhelmed, or out of control.  Anger. This is feeling irritated or impatient.  Depression. This is feeling sad, down, helpless, or guilty.  Difficulty focusing, remembering, or making decisions. Stress may cause physical symptoms, including the following:   Aches and pains. These may affect your head, neck, back, stomach, or other areas of your body.  Tight muscles or clenched jaw.  Low energy or trouble sleeping. Stress may cause unhealthy behaviors, including the following:   Eating to feel better (overeating) or skipping meals.  Sleeping too little, too much, or both.  Working  too much or putting off tasks (procrastination).  Smoking, drinking alcohol, or using drugs to feel better. DIAGNOSIS  Stress is diagnosed through an assessment by your health care provider. Your health care provider will ask questions about your symptoms and any stressful life events.Your health care provider will also ask about your medical history and may order blood tests or other tests. Certain medical conditions and medicine can cause physical symptoms similar to stress. Mental illness can cause emotional symptoms and unhealthy behaviors similar to stress. Your health care provider may refer you to a mental health professional for further evaluation.  TREATMENT  Stress management is the recommended treatment for stress.The goals of stress management are reducing stressful life events and coping with stress in healthy ways.  Techniques for reducing stressful life events include the following:  Stress identification. Self-monitor for stress and identify what causes stress for you. These skills may help you to avoid some stressful events.  Time management. Set your priorities, keep a calendar of events, and learn to say "no." These tools can help you avoid making too many commitments. Techniques for coping with stress include the following:  Rethinking the problem. Try to think realistically about stressful events rather than ignoring them or overreacting. Try to find the positives in a stressful situation rather than focusing on the negatives.  Exercise. Physical exercise can release both physical and emotional tension. The key is to find a form of exercise you enjoy and do it regularly.  Relaxation techniques. These relax the body and mind. Examples include yoga, meditation, tai chi, biofeedback, deep  breathing, progressive muscle relaxation, listening to music, being out in nature, journaling, and other hobbies. Again, the key is to find one or more that you enjoy and can do  regularly.  Healthy lifestyle. Eat a balanced diet, get plenty of sleep, and do not smoke. Avoid using alcohol or drugs to relax.  Strong support network. Spend time with family, friends, or other people you enjoy being around.Express your feelings and talk things over with someone you trust. Counseling or talktherapy with a mental health professional may be helpful if you are having difficulty managing stress on your own. Medicine is typically not recommended for the treatment of stress.Talk to your health care provider if you think you need medicine for symptoms of stress. HOME CARE INSTRUCTIONS  Keep all follow-up visits as directed by your health care provider.  Take all medicines as directed by your health care provider. SEEK MEDICAL CARE IF:  Your symptoms get worse or you start having new symptoms.  You feel overwhelmed by your problems and can no longer manage them on your own. SEEK IMMEDIATE MEDICAL CARE IF:  You feel like hurting yourself or someone else.   This information is not intended to replace advice given to you by your health care provider. Make sure you discuss any questions you have with your health care provider.   Document Released: 02/02/2001 Document Revised: 08/30/2014 Document Reviewed: 04/03/2013 Elsevier Interactive Patient Education 2016 [employer]  

## 2016-01-15 NOTE — Progress Notes (Signed)
   Subjective:    Patient ID: Stephanie Buckley, female    DOB: 08/23/1976, 40 y.o.   MRN: [id number]  HPI  PAtient in today for follow up of adult ADHD- she is currently on adderall xr 20mg  daily- she says it helps her to stay focused at work, but does not seem to be helping like t use to. SHe denies any side effects.     Review of Systems  Constitutional: Negative.   HENT: Negative.   Respiratory: Negative.   Cardiovascular: Negative.   Genitourinary: Negative.   Neurological: Negative.   Psychiatric/Behavioral: Negative.   All other systems reviewed and are negative.      Objective:   Physical Exam  Constitutional: She is oriented to person, place, and time. She appears well-developed and well-nourished. No distress.  Cardiovascular: Normal rate, regular rhythm and normal heart sounds.   Pulmonary/Chest: Effort normal and breath sounds normal.  Neurological: She is alert and oriented to person, place, and time.  Skin: Skin is warm.  Psychiatric: She has a normal mood and affect. Her behavior is normal. Thought content normal.    BP 106/76 mmHg  Pulse 89  Temp(Src) 97.3 F (36.3 C) (Oral)  Ht 5\' 7"  (1.702 m)  Wt 196 lb (88.905 kg)  BMI 30.69 kg/m2       Assessment & Plan:  1. ADHD, adult residual type stress management Patient wants to discuss possibly changing meds at next visit - amphetamine-dextroamphetamine (ADDERALL XR) 20 MG 24 hr capsule; Take 2 capsules (40 mg total) by mouth daily.  Dispense: 60 capsule; Refill: 0 - amphetamine-dextroamphetamine (ADDERALL XR) 20 MG 24 hr capsule; Take 2 capsules (40 mg total) by mouth daily.  Dispense: 60 capsule; Refill: 0 - amphetamine-dextroamphetamine (ADDERALL XR) 20 MG 24 hr capsule; Take 2 capsules (40 mg total) by mouth daily.  Dispense: 60 capsule; Refill: 0  Mary-Margaret Hassell Done, FNP

## 2016-01-17 ENCOUNTER — Telehealth: Payer: Self-pay | Admitting: Nurse Practitioner

## 2016-01-20 ENCOUNTER — Telehealth: Payer: Self-pay

## 2016-01-20 NOTE — Telephone Encounter (Signed)
Detailed message left for patient.

## 2016-01-20 NOTE — Telephone Encounter (Signed)
Looks like it has been approved through 2020

## 2016-01-20 NOTE — Telephone Encounter (Signed)
Insurance authorized Amphetamine-Dextroamphetamine through 01/20/2019

## 2016-04-05 ENCOUNTER — Other Ambulatory Visit: Payer: Self-pay | Admitting: Nurse Practitioner

## 2016-04-05 DIAGNOSIS — F908 Attention-deficit hyperactivity disorder, other type: Secondary | ICD-10-CM

## 2016-04-05 MED ORDER — AMPHETAMINE-DEXTROAMPHET ER 20 MG PO CP24: 40.0000 mg | ORAL_CAPSULE | Freq: Every day | ORAL | 0 refills | Status: DC

## 2016-04-05 NOTE — Telephone Encounter (Signed)
Pt aware written Rx is at front desk ready for pickup She saw on MyChart that this does not fit the e-visit criteria. This refill was requested because her appt is for the 28 7 she will run out 2 days before that. She will keep her appt.

## 2016-04-05 NOTE — Telephone Encounter (Signed)
rx ready for pick up- and no you cannot do e visit for this- cannot do evisits for medication refills. But rechnically you need to be seen every 3 months for refill of ADHD meds.

## 2016-04-19 ENCOUNTER — Ambulatory Visit (INDEPENDENT_AMBULATORY_CARE_PROVIDER_SITE_OTHER): Payer: BC Managed Care – PPO | Admitting: Nurse Practitioner

## 2016-04-19 ENCOUNTER — Encounter: Payer: Self-pay | Admitting: Nurse Practitioner

## 2016-04-19 DIAGNOSIS — F900 Attention-deficit hyperactivity disorder, predominantly inattentive type: Secondary | ICD-10-CM | POA: Diagnosis not present

## 2016-04-19 DIAGNOSIS — F908 Attention-deficit hyperactivity disorder, other type: Secondary | ICD-10-CM

## 2016-04-19 MED ORDER — AMPHETAMINE-DEXTROAMPHET ER 20 MG PO CP24: 40.0000 mg | ORAL_CAPSULE | Freq: Every day | ORAL | 0 refills | Status: DC

## 2016-04-19 NOTE — Patient Instructions (Signed)
Stress and Stress Management Stress is a normal reaction to life events. It is what you feel when life demands more than you are used to or more than you can handle. Some stress can be useful. For example, the stress reaction can help you catch the last bus of the day, study for a test, or meet a deadline at work. But stress that occurs too often or for too long can cause problems. It can affect your emotional health and interfere with relationships and normal daily activities. Too much stress can weaken your immune system and increase your risk for physical illness. If you already have a medical problem, stress can make it worse. CAUSES  All sorts of life events may cause stress. An event that causes stress for one person may not be stressful for another person. Major life events commonly cause stress. These may be positive or negative. Examples include losing your job, moving into a new home, getting married, having a baby, or losing a loved one. Less obvious life events may also cause stress, especially if they occur day after day or in combination. Examples include working long hours, driving in traffic, caring for children, being in debt, or being in a difficult relationship. SIGNS AND SYMPTOMS Stress may cause emotional symptoms including, the following:  Anxiety. This is feeling worried, afraid, on edge, overwhelmed, or out of control.  Anger. This is feeling irritated or impatient.  Depression. This is feeling sad, down, helpless, or guilty.  Difficulty focusing, remembering, or making decisions. Stress may cause physical symptoms, including the following:   Aches and pains. These may affect your head, neck, back, stomach, or other areas of your body.  Tight muscles or clenched jaw.  Low energy or trouble sleeping. Stress may cause unhealthy behaviors, including the following:   Eating to feel better (overeating) or skipping meals.  Sleeping too little, too much, or both.  Working  too much or putting off tasks (procrastination).  Smoking, drinking alcohol, or using drugs to feel better. DIAGNOSIS  Stress is diagnosed through an assessment by your health care provider. Your health care provider will ask questions about your symptoms and any stressful life events.Your health care provider will also ask about your medical history and may order blood tests or other tests. Certain medical conditions and medicine can cause physical symptoms similar to stress. Mental illness can cause emotional symptoms and unhealthy behaviors similar to stress. Your health care provider may refer you to a mental health professional for further evaluation.  TREATMENT  Stress management is the recommended treatment for stress.The goals of stress management are reducing stressful life events and coping with stress in healthy ways.  Techniques for reducing stressful life events include the following:  Stress identification. Self-monitor for stress and identify what causes stress for you. These skills may help you to avoid some stressful events.  Time management. Set your priorities, keep a calendar of events, and learn to say "no." These tools can help you avoid making too many commitments. Techniques for coping with stress include the following:  Rethinking the problem. Try to think realistically about stressful events rather than ignoring them or overreacting. Try to find the positives in a stressful situation rather than focusing on the negatives.  Exercise. Physical exercise can release both physical and emotional tension. The key is to find a form of exercise you enjoy and do it regularly.  Relaxation techniques. These relax the body and mind. Examples include yoga, meditation, tai chi, biofeedback, deep  breathing, progressive muscle relaxation, listening to music, being out in nature, journaling, and other hobbies. Again, the key is to find one or more that you enjoy and can do  regularly.  Healthy lifestyle. Eat a balanced diet, get plenty of sleep, and do not smoke. Avoid using alcohol or drugs to relax.  Strong support network. Spend time with family, friends, or other people you enjoy being around.Express your feelings and talk things over with someone you trust. Counseling or talktherapy with a mental health professional may be helpful if you are having difficulty managing stress on your own. Medicine is typically not recommended for the treatment of stress.Talk to your health care provider if you think you need medicine for symptoms of stress. HOME CARE INSTRUCTIONS  Keep all follow-up visits as directed by your health care provider.  Take all medicines as directed by your health care provider. SEEK MEDICAL CARE IF:  Your symptoms get worse or you start having new symptoms.  You feel overwhelmed by your problems and can no longer manage them on your own. SEEK IMMEDIATE MEDICAL CARE IF:  You feel like hurting yourself or someone else.   This information is not intended to replace advice given to you by your health care provider. Make sure you discuss any questions you have with your health care provider.   Document Released: 02/02/2001 Document Revised: 08/30/2014 Document Reviewed: 04/03/2013 Elsevier Interactive Patient Education 2016 [employer]  

## 2016-04-19 NOTE — Progress Notes (Signed)
   Subjective:    Patient ID: Stephanie Buckley, female    DOB: 1975/12/12, 40 y.o.   MRN: [id number]  HPI Patient comes in today for follow up of adult residual ADHD. She is currently on adderall XR 20mg  daily. She has been on this dose for awhile. Is doing well. NO complaints today.    Review of Systems  Constitutional: Negative.   HENT: Negative.   Respiratory: Negative.   Cardiovascular: Negative.   Genitourinary: Negative.   Neurological: Negative.   Psychiatric/Behavioral: Negative.   All other systems reviewed and are negative.      Objective:   Physical Exam  Constitutional: She is oriented to person, place, and time. She appears well-developed and well-nourished.  Cardiovascular: Normal rate, regular rhythm and normal heart sounds.   Pulmonary/Chest: Effort normal and breath sounds normal.  Neurological: She is alert and oriented to person, place, and time.  Skin: Skin is warm.  Psychiatric: She has a normal mood and affect. Her behavior is normal. Judgment and thought content normal.   BP 110/79   Pulse 88   Temp 97.1 F (36.2 C) (Oral)   Ht 5\' 7"  (1.702 m)   Wt 202 lb (91.6 kg)   BMI 31.64 kg/m         Assessment & Plan:   1. ADHD, adult residual type    Meds ordered this encounter  Medications  . amphetamine-dextroamphetamine (ADDERALL XR) 20 MG 24 hr capsule    Sig: Take 2 capsules (40 mg total) by mouth daily.    Dispense:  60 capsule    Refill:  0    DO NOT FILL TILL 05/05/16    Order Specific Question:   Supervising Provider    Answer:   Assunta Found L [[id number]]  . amphetamine-dextroamphetamine (ADDERALL XR) 20 MG 24 hr capsule    Sig: Take 2 capsules (40 mg total) by mouth daily.    Dispense:  60 capsule    Refill:  0    Do not fill till 06/03/16    Order Specific Question:   Supervising Provider    Answer:   Assunta Found L [[id number]]  . amphetamine-dextroamphetamine (ADDERALL XR) 20 MG 24 hr capsule    Sig: Take 2 capsules (40 mg total) by  mouth daily.    Dispense:  60 capsule    Refill:  0    DO NOT FILL TILL 11/11/7    Order Specific Question:   Supervising Provider    Answer:   Eustaquio Maize [[id number]]   Stress management Follow up in 3 months Mary-Margaret Hassell Done, FNP

## 2016-07-27 ENCOUNTER — Ambulatory Visit: Payer: BC Managed Care – PPO | Admitting: Nurse Practitioner

## 2016-08-09 ENCOUNTER — Encounter: Payer: Self-pay | Admitting: Nurse Practitioner

## 2016-08-09 ENCOUNTER — Ambulatory Visit (INDEPENDENT_AMBULATORY_CARE_PROVIDER_SITE_OTHER): Payer: BC Managed Care – PPO | Admitting: Nurse Practitioner

## 2016-08-09 VITALS — BP 111/79 | HR 88 | Temp 97.3°F | Ht 67.0 in | Wt 207.0 lb

## 2016-08-09 DIAGNOSIS — F908 Attention-deficit hyperactivity disorder, other type: Secondary | ICD-10-CM | POA: Diagnosis not present

## 2016-08-09 MED ORDER — AMPHETAMINE-DEXTROAMPHET ER 20 MG PO CP24: 40.0000 mg | ORAL_CAPSULE | Freq: Every day | ORAL | 0 refills | Status: DC

## 2016-08-09 NOTE — Patient Instructions (Signed)
Stress and Stress Management Stress is a normal reaction to life events. It is what you feel when life demands more than you are used to or more than you can handle. Some stress can be useful. For example, the stress reaction can help you catch the last bus of the day, study for a test, or meet a deadline at work. But stress that occurs too often or for too long can cause problems. It can affect your emotional health and interfere with relationships and normal daily activities. Too much stress can weaken your immune system and increase your risk for physical illness. If you already have a medical problem, stress can make it worse. What are the causes? All sorts of life events may cause stress. An event that causes stress for one person may not be stressful for another person. Major life events commonly cause stress. These may be positive or negative. Examples include losing your job, moving into a new home, getting married, having a baby, or losing a loved one. Less obvious life events may also cause stress, especially if they occur day after day or in combination. Examples include working long hours, driving in traffic, caring for children, being in debt, or being in a difficult relationship. What are the signs or symptoms? Stress may cause emotional symptoms including, the following:  Anxiety. This is feeling worried, afraid, on edge, overwhelmed, or out of control.  Anger. This is feeling irritated or impatient.  Depression. This is feeling sad, down, helpless, or guilty.  Difficulty focusing, remembering, or making decisions. Stress may cause physical symptoms, including the following:  Aches and pains. These may affect your head, neck, back, stomach, or other areas of your body.  Tight muscles or clenched jaw.  Low energy or trouble sleeping. Stress may cause unhealthy behaviors, including the following:  Eating to feel better (overeating) or skipping meals.  Sleeping too little, too  much, or both.  Working too much or putting off tasks (procrastination).  Smoking, drinking alcohol, or using drugs to feel better. How is this diagnosed? Stress is diagnosed through an assessment by your health care provider. Your health care provider will ask questions about your symptoms and any stressful life events.Your health care provider will also ask about your medical history and may order blood tests or other tests. Certain medical conditions and medicine can cause physical symptoms similar to stress. Mental illness can cause emotional symptoms and unhealthy behaviors similar to stress. Your health care provider may refer you to a mental health professional for further evaluation. How is this treated? Stress management is the recommended treatment for stress.The goals of stress management are reducing stressful life events and coping with stress in healthy ways. Techniques for reducing stressful life events include the following:  Stress identification. Self-monitor for stress and identify what causes stress for you. These skills may help you to avoid some stressful events.  Time management. Set your priorities, keep a calendar of events, and learn to say "no." These tools can help you avoid making too many commitments. Techniques for coping with stress include the following:  Rethinking the problem. Try to think realistically about stressful events rather than ignoring them or overreacting. Try to find the positives in a stressful situation rather than focusing on the negatives.  Exercise. Physical exercise can release both physical and emotional tension. The key is to find a form of exercise you enjoy and do it regularly.  Relaxation techniques. These relax the body and mind. Examples include yoga,  meditation, tai chi, biofeedback, deep breathing, progressive muscle relaxation, listening to music, being out in nature, journaling, and other hobbies. Again, the key is to find one or  more that you enjoy and can do regularly.  Healthy lifestyle. Eat a balanced diet, get plenty of sleep, and do not smoke. Avoid using alcohol or drugs to relax.  Strong support network. Spend time with family, friends, or other people you enjoy being around.Express your feelings and talk things over with someone you trust. Counseling or talktherapy with a mental health professional may be helpful if you are having difficulty managing stress on your own. Medicine is typically not recommended for the treatment of stress.Talk to your health care provider if you think you need medicine for symptoms of stress. Follow these instructions at home:  Keep all follow-up visits as directed by your health care provider.  Take all medicines as directed by your health care provider. Contact a health care provider if:  Your symptoms get worse or you start having new symptoms.  You feel overwhelmed by your problems and can no longer manage them on your own. Get help right away if:  You feel like hurting yourself or someone else. This information is not intended to replace advice given to you by your health care provider. Make sure you discuss any questions you have with your health care provider. Document Released: 02/02/2001 Document Revised: 01/15/2016 Document Reviewed: 04/03/2013 Elsevier Interactive Patient Education  2017 [employer].

## 2016-08-09 NOTE — Progress Notes (Signed)
   Subjective:    Patient ID: Stephanie Buckley, female    DOB: 03/16/1976, 40 y.o.   MRN: [id number]  HPI Patient comes in today for adult ADHD follow up- she has been on adderall for awhile now- It helps her to concentrate at work and get her work done. SHe has no side effects from medication. Would like to stay on current dose.    Review of Systems  Constitutional: Negative.   HENT: Negative.   Respiratory: Negative.   Cardiovascular: Negative.   Genitourinary: Negative.   Neurological: Negative.   Psychiatric/Behavioral: Negative.   All other systems reviewed and are negative.      Objective:   Physical Exam  Constitutional: She is oriented to person, place, and time. She appears well-developed and well-nourished. No distress.  Cardiovascular: Normal rate, regular rhythm and normal heart sounds.   Pulmonary/Chest: Effort normal.  Neurological: She is alert and oriented to person, place, and time.  Skin: Skin is warm.  Psychiatric: She has a normal mood and affect. Her behavior is normal. Judgment and thought content normal.    BP 111/79   Pulse 88   Temp 97.3 F (36.3 C) (Oral)   Ht 5\' 7"  (1.702 m)   Wt 207 lb (93.9 kg)   BMI 32.42 kg/m        Assessment & Plan:  1. ADHD, adult residual type Stress management Follow up in 3 months - amphetamine-dextroamphetamine (ADDERALL XR) 20 MG 24 hr capsule; Take 2 capsules (40 mg total) by mouth daily.  Dispense: 60 capsule; Refill: 0 - amphetamine-dextroamphetamine (ADDERALL XR) 20 MG 24 hr capsule; Take 2 capsules (40 mg total) by mouth daily.  Dispense: 60 capsule; Refill: 0 - amphetamine-dextroamphetamine (ADDERALL XR) 20 MG 24 hr capsule; Take 2 capsules (40 mg total) by mouth daily.  Dispense: 60 capsule; Refill: 0   Mary-Margaret Hassell Done, FNP

## 2016-11-08 ENCOUNTER — Ambulatory Visit (INDEPENDENT_AMBULATORY_CARE_PROVIDER_SITE_OTHER): Payer: BC Managed Care – PPO | Admitting: Nurse Practitioner

## 2016-11-08 ENCOUNTER — Encounter: Payer: Self-pay | Admitting: Nurse Practitioner

## 2016-11-08 VITALS — BP 116/83 | Temp 98.0°F | Ht 67.0 in | Wt 214.0 lb

## 2016-11-08 DIAGNOSIS — B9789 Other viral agents as the cause of diseases classified elsewhere: Secondary | ICD-10-CM | POA: Diagnosis not present

## 2016-11-08 DIAGNOSIS — F908 Attention-deficit hyperactivity disorder, other type: Secondary | ICD-10-CM

## 2016-11-08 DIAGNOSIS — J069 Acute upper respiratory infection, unspecified: Secondary | ICD-10-CM

## 2016-11-08 DIAGNOSIS — Z20828 Contact with and (suspected) exposure to other viral communicable diseases: Secondary | ICD-10-CM | POA: Diagnosis not present

## 2016-11-08 LAB — VERITOR FLU A/B WAIVED
INFLUENZA B: NEGATIVE
Influenza A: NEGATIVE

## 2016-11-08 MED ORDER — AMPHETAMINE-DEXTROAMPHET ER 20 MG PO CP24: 40.0000 mg | ORAL_CAPSULE | Freq: Every day | ORAL | 0 refills | Status: DC

## 2016-11-08 NOTE — Patient Instructions (Signed)

## 2016-11-08 NOTE — Progress Notes (Signed)
   Subjective:    Patient ID: Stephanie Buckley, female    DOB: [birth year], 41 y.o.   MRN: [id number]  HPI Patient comes in today fro follow up of adult ADHD. SHe has been on adderall for several years. SHe is not able to concentrate at work without medication. She denies any side effects.  C/O cough and congestion- no fever- son had flu last week   Review of Systems  Constitutional: Negative.   HENT: Negative.   Respiratory: Negative.   Cardiovascular: Negative.   Genitourinary: Negative.   Neurological: Negative.   Psychiatric/Behavioral: Negative.   All other systems reviewed and are negative.      Objective:   Physical Exam  Constitutional: She is oriented to person, place, and time. She appears well-developed and well-nourished. No distress.  Cardiovascular: Normal rate and regular rhythm.   Neurological: She is alert and oriented to person, place, and time.  Skin: Skin is warm.  Psychiatric: She has a normal mood and affect. Her behavior is normal. Judgment and thought content normal.   BP 116/83   Temp 98 F (36.7 C) (Oral)   Ht 5\' 7"  (1.702 m)   Wt 214 lb (97.1 kg)   BMI 33.52 kg/m   Flu negative      Assessment & Plan:  1. ADHD, adult residual type Stress management - amphetamine-dextroamphetamine (ADDERALL XR) 20 MG 24 hr capsule; Take 2 capsules (40 mg total) by mouth daily.  Dispense: 60 capsule; Refill: 0 - amphetamine-dextroamphetamine (ADDERALL XR) 20 MG 24 hr capsule; Take 2 capsules (40 mg total) by mouth daily.  Dispense: 60 capsule; Refill: 0 - amphetamine-dextroamphetamine (ADDERALL XR) 20 MG 24 hr capsule; Take 2 capsules (40 mg total) by mouth daily.  Dispense: 60 capsule; Refill: 0  2. Exposure to the flu - Veritor Flu A/B Waived  3. Viral URI with cough 1. Take meds as prescribed 2. Use a cool mist humidifier especially during the winter months and when heat has been humid. 3. Use saline nose sprays frequently 4. Saline irrigations of the  nose can be very helpful if done frequently.  * 4X daily for 1 week*  * Use of a nettie pot can be helpful with this. Follow directions with this* 5. Drink plenty of fluids 6. Keep thermostat turn down low 7.For any cough or congestion  Use plain Mucinex- regular strength or max strength is fine   * Children- consult with Pharmacist for dosing 8. For fever or aces or pains- take tylenol or ibuprofen appropriate for age and weight.  * for fevers greater than 101 orally you may alternate ibuprofen and tylenol every  3 hours.   Mary-Margaret Hassell Done, FNP

## 2016-11-08 NOTE — Progress Notes (Signed)
   Subjective:    Patient ID: Stephanie Buckley, female    DOB: 02-28-1976, 41 y.o.   MRN: [id number]  HPI    Review of Systems     Objective:   Physical Exam        Assessment & Plan:

## 2017-02-08 ENCOUNTER — Telehealth: Payer: Self-pay | Admitting: Nurse Practitioner

## 2017-02-08 DIAGNOSIS — F908 Attention-deficit hyperactivity disorder, other type: Secondary | ICD-10-CM

## 2017-02-08 MED ORDER — AMPHETAMINE-DEXTROAMPHET ER 20 MG PO CP24: 40.0000 mg | ORAL_CAPSULE | Freq: Every day | ORAL | 0 refills | Status: DC

## 2017-02-08 NOTE — Telephone Encounter (Signed)
Patient will run out of medication on 02/11/17 but has an appt with MMM 02/15/17  Patient would like to know if she could have Rx to cover until appt with MMM

## 2017-02-08 NOTE — Addendum Note (Signed)
Addended by: Evelina Dun A on: 02/08/2017 12:01 PM   Modules accepted: Orders

## 2017-02-08 NOTE — Telephone Encounter (Signed)
RX ready for pick up. Keep follow up appt

## 2017-02-08 NOTE — Telephone Encounter (Signed)
Patient aware that script up front for pick up

## 2017-02-15 ENCOUNTER — Ambulatory Visit (INDEPENDENT_AMBULATORY_CARE_PROVIDER_SITE_OTHER): Payer: BC Managed Care – PPO | Admitting: Nurse Practitioner

## 2017-02-15 ENCOUNTER — Encounter: Payer: Self-pay | Admitting: Nurse Practitioner

## 2017-02-15 DIAGNOSIS — F908 Attention-deficit hyperactivity disorder, other type: Secondary | ICD-10-CM | POA: Diagnosis not present

## 2017-02-15 MED ORDER — AMPHETAMINE-DEXTROAMPHET ER 20 MG PO CP24: 40.0000 mg | ORAL_CAPSULE | Freq: Every day | ORAL | 0 refills | Status: DC

## 2017-02-15 NOTE — Progress Notes (Signed)
   Subjective:    Patient ID: Stephanie Buckley, female    DOB: 07/08/1976, 41 y.o.   MRN: [id number]  HPI Patient comes in today for follow up of residual adult ADHD. SHe is currently  on adderallxr20mg  daily. She says that she has no side effects from medication. Keeps her calm and focused at work.    Review of Systems  Constitutional: Negative.   Respiratory: Negative.   Cardiovascular: Negative.   Neurological: Negative.   Psychiatric/Behavioral: Negative.   All other systems reviewed and are negative.      Objective:   Physical Exam  Constitutional: She is oriented to person, place, and time. She appears well-developed and well-nourished. No distress.  Cardiovascular: Normal rate and regular rhythm.   Pulmonary/Chest: Effort normal and breath sounds normal.  Neurological: She is alert and oriented to person, place, and time.  Skin: Skin is warm.  Psychiatric: She has a normal mood and affect. Her behavior is normal. Judgment and thought content normal.   BP 108/76   Pulse 85   Temp 97.3 F (36.3 C) (Oral)   Ht 5\' 7"  (1.702 m)   Wt 216 lb (98 kg)   BMI 33.83 kg/m       Assessment & Plan:   1. ADHD, adult residual type    Continue stress management Follow up in 3 months Meds ordered this encounter  Medications  . amphetamine-dextroamphetamine (ADDERALL XR) 20 MG 24 hr capsule    Sig: Take 2 capsules (40 mg total) by mouth daily.    Dispense:  60 capsule    Refill:  0    DO NOT FILL TILL 03/09/17    Order Specific Question:   Supervising Provider    Answer:   Assunta Found L [[id number]]  . amphetamine-dextroamphetamine (ADDERALL XR) 20 MG 24 hr capsule    Sig: Take 2 capsules (40 mg total) by mouth daily.    Dispense:  60 capsule    Refill:  0    Do not fill till 04/08/17    Order Specific Question:   Supervising Provider    Answer:   Assunta Found L [[id number]]  . amphetamine-dextroamphetamine (ADDERALL XR) 20 MG 24 hr capsule    Sig: Take 2 capsules (40 mg total) by  mouth daily.    Dispense:  60 capsule    Refill:  0    DO NOT FILL TILL 05/08/17    Order Specific Question:   Supervising Provider    Answer:   Eustaquio Maize [[id number]]   Mary-Margaret Hassell Done, FNP

## 2017-05-19 ENCOUNTER — Encounter: Payer: Self-pay | Admitting: Nurse Practitioner

## 2017-05-19 ENCOUNTER — Ambulatory Visit (INDEPENDENT_AMBULATORY_CARE_PROVIDER_SITE_OTHER): Payer: BC Managed Care – PPO | Admitting: Nurse Practitioner

## 2017-05-19 DIAGNOSIS — F908 Attention-deficit hyperactivity disorder, other type: Secondary | ICD-10-CM | POA: Diagnosis not present

## 2017-05-19 MED ORDER — AMPHETAMINE-DEXTROAMPHET ER 20 MG PO CP24: 40.0000 mg | ORAL_CAPSULE | Freq: Every day | ORAL | 0 refills | Status: DC

## 2017-05-19 NOTE — Progress Notes (Signed)
   Subjective:    Patient ID: Stephanie Buckley, female    DOB: [birth year], 41 y.o.   MRN: [id number]  HPI Patient comes in today for follow up of residual adult ADHD. She is currently on adderall 20 mg BID. She has been on this for awhile and is unable to get work done without taking. SHe is having no medication side effects.  ADHD contract signed 05/19/17  Review of Systems  Constitutional: Negative.   Respiratory: Negative.   Cardiovascular: Negative.   Neurological: Negative.   Psychiatric/Behavioral: Negative.   All other systems reviewed and are negative.      Objective:   Physical Exam  Constitutional: She is oriented to person, place, and time. She appears well-developed and well-nourished. No distress.  Cardiovascular: Normal rate and regular rhythm.   Pulmonary/Chest: Effort normal and breath sounds normal.  Neurological: She is alert and oriented to person, place, and time.  Skin: Skin is warm.  Psychiatric: She has a normal mood and affect. Her behavior is normal. Judgment and thought content normal.    BP 113/83   Pulse 85   Temp 97.6 F (36.4 C) (Oral)   Ht 5\' 7"  (1.702 m)   Wt 218 lb (98.9 kg)   BMI 34.14 kg/m        Assessment & Plan:   1. ADHD, adult residual type    Meds ordered this encounter  Medications  . amphetamine-dextroamphetamine (ADDERALL XR) 20 MG 24 hr capsule    Sig: Take 2 capsules (40 mg total) by mouth daily.    Dispense:  60 capsule    Refill:  0    DO NOT FILL TILL 06/11/17    Order Specific Question:   Supervising Provider    Answer:   Assunta Found L [[id number]]  . amphetamine-dextroamphetamine (ADDERALL XR) 20 MG 24 hr capsule    Sig: Take 2 capsules (40 mg total) by mouth daily.    Dispense:  60 capsule    Refill:  0    Do not fill till 07/11/17    Order Specific Question:   Supervising Provider    Answer:   Assunta Found L [[id number]]  . amphetamine-dextroamphetamine (ADDERALL XR) 20 MG 24 hr capsule    Sig: Take 2 capsules (40  mg total) by mouth daily.    Dispense:  60 capsule    Refill:  0    DO NOT FILL TILL 08/09/17    Order Specific Question:   Supervising Provider    Answer:   Eustaquio Maize [[id number]]   Continue stress management Follow up in 3-4 months  Mary-Margaret Hassell Done, FNP

## 2017-08-29 ENCOUNTER — Ambulatory Visit: Payer: BC Managed Care – PPO | Admitting: Nurse Practitioner

## 2017-08-29 ENCOUNTER — Encounter: Payer: Self-pay | Admitting: Nurse Practitioner

## 2017-08-29 DIAGNOSIS — F908 Attention-deficit hyperactivity disorder, other type: Secondary | ICD-10-CM | POA: Diagnosis not present

## 2017-08-29 MED ORDER — AMPHETAMINE-DEXTROAMPHET ER 20 MG PO CP24: 40.0000 mg | ORAL_CAPSULE | Freq: Every day | ORAL | 0 refills | Status: DC

## 2017-08-29 MED ORDER — AMPHETAMINE-DEXTROAMPHET ER 20 MG PO CP24
40.0000 mg | ORAL_CAPSULE | Freq: Every day | ORAL | 0 refills | Status: DC
Start: 2017-08-29 — End: 2017-11-29

## 2017-08-29 NOTE — Progress Notes (Signed)
   Subjective:    Patient ID: Stephanie Buckley, female    DOB: 1975/12/21, 42 y.o.   MRN: [id number]  HPI Patient comes in today for follow up of ADHD. SHe has had this for many years. Is currently on adderall XR 20mg  daily 2 a day . She says that she is unable to concentrate at work without medication. She denies any medication side effects.    Review of Systems  Constitutional: Negative for activity change and appetite change.  HENT: Negative.   Eyes: Negative for pain.  Respiratory: Negative for shortness of breath.   Cardiovascular: Negative for chest pain, palpitations and leg swelling.  Gastrointestinal: Negative for abdominal pain.  Endocrine: Negative for polydipsia.  Genitourinary: Negative.   Skin: Negative for rash.  Neurological: Negative for dizziness, weakness and headaches.  Hematological: Does not bruise/bleed easily.  Psychiatric/Behavioral: Negative.   All other systems reviewed and are negative.      Objective:   Physical Exam  Constitutional: She is oriented to person, place, and time. She appears well-developed and well-nourished.  HENT:  Nose: Nose normal.  Mouth/Throat: Oropharynx is clear and moist.  Eyes: EOM are normal.  Neck: Trachea normal, normal range of motion and full passive range of motion without pain. Neck supple. No JVD present. Carotid bruit is not present. No thyromegaly present.  Cardiovascular: Normal rate, regular rhythm, normal heart sounds and intact distal pulses. Exam reveals no gallop and no friction rub.  No murmur heard. Pulmonary/Chest: Effort normal and breath sounds normal.  Abdominal: Soft. Bowel sounds are normal. She exhibits no distension and no mass. There is no tenderness.  Musculoskeletal: Normal range of motion.  Lymphadenopathy:    She has no cervical adenopathy.  Neurological: She is alert and oriented to person, place, and time. She has normal reflexes.  Skin: Skin is warm and dry.  Psychiatric: She has a normal  mood and affect. Her behavior is normal. Judgment and thought content normal.   BP 110/78   Pulse 92   Temp (!) 97.2 F (36.2 C) (Oral)   Ht 5\' 7"  (1.702 m)   Wt 209 lb (94.8 kg)   BMI 32.73 kg/m      Assessment & Plan:  1. ADHD, adult residual type Stress management - amphetamine-dextroamphetamine (ADDERALL XR) 20 MG 24 hr capsule; Take 2 capsules (40 mg total) by mouth daily.  Dispense: 60 capsule; Refill: 0 - amphetamine-dextroamphetamine (ADDERALL XR) 20 MG 24 hr capsule; Take 2 capsules (40 mg total) by mouth daily.  Dispense: 60 capsule; Refill: 0 - amphetamine-dextroamphetamine (ADDERALL XR) 20 MG 24 hr capsule; Take 2 capsules (40 mg total) by mouth daily.  Dispense: 60 capsule; Refill: 0  Patient will schedule CPE for next visit and will order mammogram then  [place], FNP

## 2017-11-29 ENCOUNTER — Ambulatory Visit (INDEPENDENT_AMBULATORY_CARE_PROVIDER_SITE_OTHER): Payer: BC Managed Care – PPO | Admitting: Nurse Practitioner

## 2017-11-29 ENCOUNTER — Encounter: Payer: Self-pay | Admitting: Nurse Practitioner

## 2017-11-29 VITALS — BP 108/75 | HR 90 | Temp 97.7°F | Ht 67.0 in | Wt 209.0 lb

## 2017-11-29 DIAGNOSIS — Z0000 Encounter for general adult medical examination without abnormal findings: Secondary | ICD-10-CM

## 2017-11-29 DIAGNOSIS — F908 Attention-deficit hyperactivity disorder, other type: Secondary | ICD-10-CM

## 2017-11-29 DIAGNOSIS — Z01419 Encounter for gynecological examination (general) (routine) without abnormal findings: Secondary | ICD-10-CM | POA: Diagnosis not present

## 2017-11-29 LAB — MICROSCOPIC EXAMINATION
Bacteria, UA: NONE SEEN
RBC MICROSCOPIC, UA: NONE SEEN /HPF (ref 0–2)
Renal Epithel, UA: NONE SEEN /hpf

## 2017-11-29 LAB — URINALYSIS, COMPLETE
Bilirubin, UA: NEGATIVE
GLUCOSE, UA: NEGATIVE
LEUKOCYTES UA: NEGATIVE
Nitrite, UA: NEGATIVE
Protein, UA: NEGATIVE
RBC, UA: NEGATIVE
SPEC GRAV UA: 1.015 (ref 1.005–1.030)
Urobilinogen, Ur: 0.2 mg/dL (ref 0.2–1.0)
pH, UA: 7 (ref 5.0–7.5)

## 2017-11-29 MED ORDER — AMPHETAMINE-DEXTROAMPHET ER 20 MG PO CP24
40.0000 mg | ORAL_CAPSULE | Freq: Every day | ORAL | 0 refills | Status: DC
Start: 2017-12-08 — End: 2018-03-02

## 2017-11-29 MED ORDER — AMPHETAMINE-DEXTROAMPHET ER 20 MG PO CP24
40.0000 mg | ORAL_CAPSULE | Freq: Every day | ORAL | 0 refills | Status: DC
Start: 2018-01-07 — End: 2018-03-02

## 2017-11-29 MED ORDER — AMPHETAMINE-DEXTROAMPHET ER 20 MG PO CP24: 40.0000 mg | ORAL_CAPSULE | Freq: Every day | ORAL | 0 refills | Status: DC

## 2017-11-29 NOTE — Progress Notes (Signed)
Subjective:    Patient ID: Stephanie Buckley, female    DOB: 1976/03/24, 42 y.o.   MRN: [id number]  HPI  Stephanie Buckley is here today for annual physical exam , PAP and follow up of chronic medical problem.  Outpatient Encounter Medications as of 11/29/2017  Medication Sig  . amphetamine-dextroamphetamine (ADDERALL XR) 20 MG 24 hr capsule Take 2 capsules (40 mg total) by mouth daily.  Marland Kitchen amphetamine-dextroamphetamine (ADDERALL XR) 20 MG 24 hr capsule Take 2 capsules (40 mg total) by mouth daily.  Marland Kitchen amphetamine-dextroamphetamine (ADDERALL XR) 20 MG 24 hr capsule Take 2 capsules (40 mg total) by mouth daily.     1. Annual physical exam   2. Gynecologic exam normal   3. ADHD, adult residual type  Patient has been on adderall for many years- she says she cannot concentrate at work without taking. She denies any side effects form medication.    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  HENT: Negative.   Eyes: Negative for pain.  Respiratory: Negative for shortness of breath.   Cardiovascular: Negative for chest pain, palpitations and leg swelling.  Gastrointestinal: Negative for abdominal pain.  Endocrine: Negative for polydipsia.  Genitourinary: Negative.   Skin: Negative for rash.  Neurological: Negative for dizziness, weakness and headaches.  Hematological: Does not bruise/bleed easily.  Psychiatric/Behavioral: Negative.   All other systems reviewed and are negative.      Objective:   Physical Exam  Constitutional: She is oriented to person, place, and time. She appears well-developed and well-nourished.  HENT:  Head: Normocephalic.  Right Ear: Hearing, tympanic membrane, external ear and ear canal normal.  Left Ear: Hearing, tympanic membrane, external ear and ear canal normal.  Nose: Nose normal.  Mouth/Throat: Uvula  is midline and oropharynx is clear and moist.  Eyes: Pupils are equal, round, and reactive to light. Conjunctivae and EOM are normal.  Neck: Normal range of motion and full passive range of motion without pain. Neck supple. No JVD present. Carotid bruit is not present. No thyroid mass and no thyromegaly present.  Cardiovascular: Normal rate, normal heart sounds and intact distal pulses.  No murmur heard. Pulmonary/Chest: Effort normal and breath sounds normal. Right breast exhibits no inverted nipple, no mass, no nipple discharge, no skin change and no tenderness. Left breast exhibits no inverted nipple, no mass, no nipple discharge, no skin change and no tenderness. No breast swelling, tenderness, discharge or bleeding.  Abdominal: Soft. Bowel sounds are normal. She exhibits no mass. There is no tenderness.  Genitourinary: Vagina normal and uterus normal. No breast swelling, tenderness, discharge or bleeding. No vaginal discharge found.  Genitourinary Comments: bimanual exam-No adnexal masses or tenderness. Cervix parous and pink  Musculoskeletal: Normal range of motion.  Lymphadenopathy:    She has no cervical adenopathy.  Neurological: She is alert and oriented to person, place, and time.  Skin: Skin is warm and dry.  Psychiatric: She has a normal mood and affect. Her behavior is normal. Judgment and thought content normal.   BP 108/75   Pulse 90   Temp 97.7 F (36.5 C) (Oral)   Ht 5' 7"  (1.702 m)   Wt 209 lb (94.8 kg)   BMI 32.73 kg/m      Assessment & Plan:  1. Annual physical exam - Urinalysis, Complete - CBC with Differential/Platelet -  CMP14+EGFR - Lipid panel - Thyroid Panel With TSH  2. Gynecologic exam normal - IGP, Aptima HPV, rfx 16/18,45  3. ADHD, adult residual type Stress management - amphetamine-dextroamphetamine (ADDERALL XR) 20 MG 24 hr capsule; Take 2 capsules (40 mg total) by mouth daily.  Dispense: 60 capsule; Refill: 0 - amphetamine-dextroamphetamine  (ADDERALL XR) 20 MG 24 hr capsule; Take 2 capsules (40 mg total) by mouth daily.  Dispense: 60 capsule; Refill: 0 - amphetamine-dextroamphetamine (ADDERALL XR) 20 MG 24 hr capsule; Take 2 capsules (40 mg total) by mouth daily.  Dispense: 60 capsule; Refill: 0    Labs pending Health maintenance reviewed Diet and exercise encouraged Continue all meds Follow up  In 3 mnths  Mary-Margaret Hassell Done, FNP

## 2017-11-29 NOTE — Patient Instructions (Signed)
Stress and Stress Management Stress is a normal reaction to life events. It is what you feel when life demands more than you are used to or more than you can handle. Some stress can be useful. For example, the stress reaction can help you catch the last bus of the day, study for a test, or meet a deadline at work. But stress that occurs too often or for too long can cause problems. It can affect your emotional health and interfere with relationships and normal daily activities. Too much stress can weaken your immune system and increase your risk for physical illness. If you already have a medical problem, stress can make it worse. What are the causes? All sorts of life events may cause stress. An event that causes stress for one person may not be stressful for another person. Major life events commonly cause stress. These may be positive or negative. Examples include losing your job, moving into a new home, getting married, having a baby, or losing a loved one. Less obvious life events may also cause stress, especially if they occur day after day or in combination. Examples include working long hours, driving in traffic, caring for children, being in debt, or being in a difficult relationship. What are the signs or symptoms? Stress may cause emotional symptoms including, the following:  Anxiety. This is feeling worried, afraid, on edge, overwhelmed, or out of control.  Anger. This is feeling irritated or impatient.  Depression. This is feeling sad, down, helpless, or guilty.  Difficulty focusing, remembering, or making decisions.  Stress may cause physical symptoms, including the following:  Aches and pains. These may affect your head, neck, back, stomach, or other areas of your body.  Tight muscles or clenched jaw.  Low energy or trouble sleeping.  Stress may cause unhealthy behaviors, including the following:  Eating to feel better (overeating) or skipping meals.  Sleeping too little,  too much, or both.  Working too much or putting off tasks (procrastination).  Smoking, drinking alcohol, or using drugs to feel better.  How is this diagnosed? Stress is diagnosed through an assessment by your health care provider. Your health care provider will ask questions about your symptoms and any stressful life events.Your health care provider will also ask about your medical history and may order blood tests or other tests. Certain medical conditions and medicine can cause physical symptoms similar to stress. Mental illness can cause emotional symptoms and unhealthy behaviors similar to stress. Your health care provider may refer you to a mental health professional for further evaluation. How is this treated? Stress management is the recommended treatment for stress.The goals of stress management are reducing stressful life events and coping with stress in healthy ways. Techniques for reducing stressful life events include the following:  Stress identification. Self-monitor for stress and identify what causes stress for you. These skills may help you to avoid some stressful events.  Time management. Set your priorities, keep a calendar of events, and learn to say "no." These tools can help you avoid making too many commitments.  Techniques for coping with stress include the following:  Rethinking the problem. Try to think realistically about stressful events rather than ignoring them or overreacting. Try to find the positives in a stressful situation rather than focusing on the negatives.  Exercise. Physical exercise can release both physical and emotional tension. The key is to find a form of exercise you enjoy and do it regularly.  Relaxation techniques. These relax the body and  mind. Examples include yoga, meditation, tai chi, biofeedback, deep breathing, progressive muscle relaxation, listening to music, being out in nature, journaling, and other hobbies. Again, the key is to find  one or more that you enjoy and can do regularly.  Healthy lifestyle. Eat a balanced diet, get plenty of sleep, and do not smoke. Avoid using alcohol or drugs to relax.  Strong support network. Spend time with family, friends, or other people you enjoy being around.Express your feelings and talk things over with someone you trust.  Counseling or talktherapy with a mental health professional may be helpful if you are having difficulty managing stress on your own. Medicine is typically not recommended for the treatment of stress.Talk to your health care provider if you think you need medicine for symptoms of stress. Follow these instructions at home:  Keep all follow-up visits as directed by your health care provider.  Take all medicines as directed by your health care provider. Contact a health care provider if:  Your symptoms get worse or you start having new symptoms.  You feel overwhelmed by your problems and can no longer manage them on your own. Get help right away if:  You feel like hurting yourself or someone else. This information is not intended to replace advice given to you by your health care provider. Make sure you discuss any questions you have with your health care provider. Document Released: 02/02/2001 Document Revised: 01/15/2016 Document Reviewed: 04/03/2013 Elsevier Interactive Patient Education  2017 [employer].

## 2017-12-02 LAB — IGP, APTIMA HPV, RFX 16/18,45
HPV APTIMA: NEGATIVE
PAP Smear Comment: 0

## 2017-12-16 ENCOUNTER — Ambulatory Visit: Payer: BC Managed Care – PPO | Admitting: Family Medicine

## 2017-12-16 ENCOUNTER — Encounter: Payer: Self-pay | Admitting: Family Medicine

## 2017-12-16 VITALS — BP 114/79 | HR 86 | Temp 97.0°F | Ht 67.0 in | Wt 210.8 lb

## 2017-12-16 DIAGNOSIS — S80861A Insect bite (nonvenomous), right lower leg, initial encounter: Secondary | ICD-10-CM

## 2017-12-16 DIAGNOSIS — L03115 Cellulitis of right lower limb: Secondary | ICD-10-CM

## 2017-12-16 DIAGNOSIS — W57XXXA Bitten or stung by nonvenomous insect and other nonvenomous arthropods, initial encounter: Secondary | ICD-10-CM | POA: Diagnosis not present

## 2017-12-16 MED ORDER — DOXYCYCLINE HYCLATE 100 MG PO TABS: 100.0000 mg | ORAL_TABLET | Freq: Two times a day (BID) | ORAL | 0 refills | Status: DC

## 2017-12-16 NOTE — Patient Instructions (Signed)
I have placed you on an oral antibiotic to take twice a day with food.  You will take this for 10 days.  If you are not noticing any improvement in your symptoms by Monday, please give me a call and I will add an additional antibiotic for strep coverage.  If you notice worsening or joint involvement over the weekend, please seek immediate medical attention in the emergency department.  You may use an oral nonsteroidal anti-inflammatory like ibuprofen or naproxen to help with pain and inflammation.  Consider icing the affected area to reduce swelling.   Cellulitis, Adult Cellulitis is a skin infection. The infected area is usually red and tender. This condition occurs most often in the arms and lower legs. The infection can travel to the muscles, blood, and underlying tissue and become serious. It is very important to get treated for this condition. What are the causes? Cellulitis is caused by bacteria. The bacteria enter through a break in the skin, such as a cut, burn, insect bite, open sore, or crack. What increases the risk? This condition is more likely to occur in people who:  Have a weak defense system (immune system).  Have open wounds on the skin such as cuts, burns, bites, and scrapes. Bacteria can enter the body through these open wounds.  Are older.  Have diabetes.  Have a type of long-lasting (chronic) liver disease (cirrhosis) or kidney disease.  Use IV drugs.  What are the signs or symptoms? Symptoms of this condition include:  Redness, streaking, or spotting on the skin.  Swollen area of the skin.  Tenderness or pain when an area of the skin is touched.  Warm skin.  Fever.  Chills.  Blisters.  How is this diagnosed? This condition is diagnosed based on a medical history and physical exam. You may also have tests, including:  Blood tests.  Lab tests.  Imaging tests.  How is this treated? Treatment for this condition may include:  Medicines, such as  antibiotic medicines or antihistamines.  Supportive care, such as rest and application of cold or warm cloths (cold or warm compresses) to the skin.  Hospital care, if the condition is severe.  The infection usually gets better within 1-2 days of treatment. Follow these instructions at home:  Take over-the-counter and prescription medicines only as told by your health care provider.  If you were prescribed an antibiotic medicine, take it as told by your health care provider. Do not stop taking the antibiotic even if you start to feel better.  Drink enough fluid to keep your urine clear or pale yellow.  Do not touch or rub the infected area.  Raise (elevate) the infected area above the level of your heart while you are sitting or lying down.  Apply warm or cold compresses to the affected area as told by your health care provider.  Keep all follow-up visits as told by your health care provider. This is important. These visits let your health care provider make sure a more serious infection is not developing. Contact a health care provider if:  You have a fever.  Your symptoms do not improve within 1-2 days of starting treatment.  Your bone or joint underneath the infected area becomes painful after the skin has healed.  Your infection returns in the same area or another area.  You notice a swollen bump in the infected area.  You develop new symptoms.  You have a general ill feeling (malaise) with muscle aches and pains.  Get help right away if:  Your symptoms get worse.  You feel very sleepy.  You develop vomiting or diarrhea that persists.  You notice red streaks coming from the infected area.  Your red area gets larger or turns dark in color. This information is not intended to replace advice given to you by your health care provider. Make sure you discuss any questions you have with your health care provider. Document Released: 05/19/2005 Document Revised: 12/18/2015  Document Reviewed: 06/18/2015 Elsevier Interactive Patient Education  [employer].

## 2017-12-16 NOTE — Progress Notes (Signed)
Subjective: CC: insect bite PCP: Chevis Pretty, FNP Stephanie Buckley is a 42 y.o. female presenting to clinic today for:  1. Insect bite Patient reports that she sustained what she thinks was an insect bite to the posterior right knee about 3 days ago.  She notes that it was significantly itchy and that she had been scratching it.  Over the last day or so, she has noticed increased redness and swelling with some associated tenderness to the posterior knee.  Denies any fevers, chills.  She does note some exudate that is clear from the lesion.  She has been keeping it covered because she was worried about potential MRSA.  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   ROS: Per HPI  Allergies  Allergen Reactions  . Keflex [Cephalexin] Rash   Past Medical History:  Diagnosis Date  . ADHD (attention deficit hyperactivity disorder)     Current Outpatient Medications:  .  [START ON 02/06/2018] amphetamine-dextroamphetamine (ADDERALL XR) 20 MG 24 hr capsule, Take 2 capsules (40 mg total) by mouth daily., Disp: 60 capsule, Rfl: 0 .  [START ON 01/07/2018] amphetamine-dextroamphetamine (ADDERALL XR) 20 MG 24 hr capsule, Take 2 capsules (40 mg total) by mouth daily., Disp: 60 capsule, Rfl: 0 .  amphetamine-dextroamphetamine (ADDERALL XR) 20 MG 24 hr capsule, Take 2 capsules (40 mg total) by mouth daily., Disp: 60 capsule, Rfl: 0 .  doxycycline (VIBRA-TABS) 100 MG tablet, Take 1 tablet (100 mg total) by mouth 2 (two) times daily., Disp: 20 tablet, Rfl: 0 Social History   Socioeconomic History  . Marital status: Married    Spouse name: Not on file  . Number of children: Not on file  . Years of education: Not on file  . Highest education level: Not on file  Occupational History  . Not on file  Social Needs  . Financial resource strain: Not on file  . Food insecurity:    Worry: Not on file    Inability: Not on file  . Transportation needs:   Medical: Not on file    Non-medical: Not on file  Tobacco Use  . Smoking status: Current Every Day Smoker  . Smokeless tobacco: Never Used  Substance and Sexual Activity  . Alcohol use: Yes  . Drug use: No  . Sexual activity: Not on file  Lifestyle  . Physical activity:    Days per week: Not on file    Minutes per session: Not on file  . Stress: Not on file  Relationships  . Social connections:    Talks on phone: Not on file    Gets together: Not on file    Attends religious service: Not on file    Active member of club or organization: Not on file    Attends meetings of clubs or organizations: Not on file    Relationship status: Not on file  . Intimate partner violence:    Fear of current or ex partner: Not on file    Emotionally abused: Not on file    Physically abused: Not on file    Forced sexual activity: Not on file  Other Topics Concern  . Not on file  Social History Narrative  . Not on file   Family History  Problem Relation Age of Onset  . Bipolar disorder Father     Objective: Office vital signs reviewed. BP 114/79   Pulse 86   Temp (!) 97 F (36.1 C) (Oral)  Ht 5\' 7"  (1.702 m)   Wt 210 lb 12.8 oz (95.6 kg)   BMI 33.02 kg/m   Physical Examination:  General: Awake, alert, well nourished, well appearing, No acute distress Skin: dry; right posterior popliteal fossa with about a 1 inch by half an inch area of erythema, induration with a central punctum.  No palpable fluctuance.  No visible exudate or purulence from lesion.  There is increased warmth.  She has no visible erythema, increased warmth or effusions within the right knee joint.  She has full active range of motion of the right lower extremity.  Assessment/ Plan: 42 y.o. female   1. Cellulitis of right lower extremity Clinically consistent with soft tissue infection.  She is afebrile and demonstrates no systemic signs of infection.  Will place on doxycycline p.o. twice daily for the next 10  days.  Take with food.  Caution sunlight exposure.  May use oral NSAID of choice for pain and inflammation.  Apply ice.  Home care instructions were reviewed with the patient.  Handout was provided.  Reasons for return evaluation and emergent evaluation in the emergency department discussed.  Follow-up as needed.  2. Insect bite of right lower leg, initial encounter Cellulitis likely began as an insect bite and through excoriation, she developed inspection.  There is nothing ulcerative or necrotic about the lesion to suggest that this is a spider bite or more severe infection.   Meds ordered this encounter  Medications  . doxycycline (VIBRA-TABS) 100 MG tablet    Sig: Take 1 tablet (100 mg total) by mouth 2 (two) times daily.    Dispense:  20 tablet    Refill:  [place], DO [place] [phone]

## 2018-03-02 ENCOUNTER — Ambulatory Visit: Payer: BC Managed Care – PPO | Admitting: Nurse Practitioner

## 2018-03-02 ENCOUNTER — Encounter: Payer: Self-pay | Admitting: Nurse Practitioner

## 2018-03-02 DIAGNOSIS — F908 Attention-deficit hyperactivity disorder, other type: Secondary | ICD-10-CM | POA: Diagnosis not present

## 2018-03-02 MED ORDER — AMPHETAMINE-DEXTROAMPHET ER 20 MG PO CP24: 40.0000 mg | ORAL_CAPSULE | Freq: Every day | ORAL | 0 refills | Status: DC

## 2018-03-02 NOTE — Progress Notes (Signed)
   Subjective:    Patient ID: Stephanie Buckley, female    DOB: May 25, 1976, 42 y.o.   MRN: [id number]    Chief Complaint: ADHD   HPI Patient comes  In today for follow up of adult residual ADHD. She has ben on adderall xr 20mg  daily. She is unable to concentrate at work. She has trouble getting work done without taking meds. She denies any side effects form medication.    Review of Systems  Constitutional: Negative for activity change and appetite change.  HENT: Negative.   Eyes: Negative for pain.  Respiratory: Negative for shortness of breath.   Cardiovascular: Negative for chest pain, palpitations and leg swelling.  Gastrointestinal: Negative for abdominal pain.  Endocrine: Negative for polydipsia.  Genitourinary: Negative.   Skin: Negative for rash.  Neurological: Negative for dizziness, weakness and headaches.  Hematological: Does not bruise/bleed easily.  Psychiatric/Behavioral: Negative.   All other systems reviewed and are negative.      Objective:   Physical Exam  Constitutional: She is oriented to person, place, and time. She appears well-developed and well-nourished. No distress.  Cardiovascular: Normal rate.  Pulmonary/Chest: Effort normal.  Neurological: She is alert and oriented to person, place, and time.  Skin: Skin is warm and dry.  Psychiatric: She has a normal mood and affect. Her behavior is normal. Thought content normal.  Nursing note and vitals reviewed.  BP 110/81   Pulse 77   Temp (!) 97.4 F (36.3 C) (Oral)   Ht 5\' 7"  (1.702 m)   Wt 211 lb (95.7 kg)   BMI 33.05 kg/m        Assessment & Plan:  [name] in today with chief complaint of ADHD   1. ADHD, adult residual type Stress management - amphetamine-dextroamphetamine (ADDERALL XR) 20 MG 24 hr capsule; Take 2 capsules (40 mg total) by mouth daily.  Dispense: 60 capsule; Refill: 0 - amphetamine-dextroamphetamine (ADDERALL XR) 20 MG 24 hr capsule; Take 2 capsules (40 mg total) by  mouth daily.  Dispense: 60 capsule; Refill: 0 - amphetamine-dextroamphetamine (ADDERALL XR) 20 MG 24 hr capsule; Take 2 capsules (40 mg total) by mouth daily.  Dispense: 60 capsule; Refill: 0  Mary-Margaret Hassell Done, FNP

## 2018-06-02 ENCOUNTER — Telehealth: Payer: Self-pay | Admitting: Nurse Practitioner

## 2018-06-03 NOTE — Telephone Encounter (Signed)
Patient aware that she has to be seen and that she has a Scientific laboratory technician and it states that she has to have an appointment and can not be filled outside of an appointment.

## 2018-06-12 ENCOUNTER — Encounter: Payer: Self-pay | Admitting: Nurse Practitioner

## 2018-06-12 ENCOUNTER — Ambulatory Visit: Payer: BC Managed Care – PPO | Admitting: Nurse Practitioner

## 2018-06-12 VITALS — BP 110/79 | HR 82 | Temp 97.8°F | Ht 67.0 in | Wt 211.0 lb

## 2018-06-12 DIAGNOSIS — F908 Attention-deficit hyperactivity disorder, other type: Secondary | ICD-10-CM | POA: Diagnosis not present

## 2018-06-12 MED ORDER — AMPHETAMINE-DEXTROAMPHET ER 20 MG PO CP24: 40.0000 mg | ORAL_CAPSULE | Freq: Every day | ORAL | 0 refills | Status: DC

## 2018-06-12 NOTE — Patient Instructions (Signed)
Stress and Stress Management Stress is a normal reaction to life events. It is what you feel when life demands more than you are used to or more than you can handle. Some stress can be useful. For example, the stress reaction can help you catch the last bus of the day, study for a test, or meet a deadline at work. But stress that occurs too often or for too long can cause problems. It can affect your emotional health and interfere with relationships and normal daily activities. Too much stress can weaken your immune system and increase your risk for physical illness. If you already have a medical problem, stress can make it worse. What are the causes? All sorts of life events may cause stress. An event that causes stress for one person may not be stressful for another person. Major life events commonly cause stress. These may be positive or negative. Examples include losing your job, moving into a new home, getting married, having a baby, or losing a loved one. Less obvious life events may also cause stress, especially if they occur day after day or in combination. Examples include working long hours, driving in traffic, caring for children, being in debt, or being in a difficult relationship. What are the signs or symptoms? Stress may cause emotional symptoms including, the following:  Anxiety. This is feeling worried, afraid, on edge, overwhelmed, or out of control.  Anger. This is feeling irritated or impatient.  Depression. This is feeling sad, down, helpless, or guilty.  Difficulty focusing, remembering, or making decisions.  Stress may cause physical symptoms, including the following:  Aches and pains. These may affect your head, neck, back, stomach, or other areas of your body.  Tight muscles or clenched jaw.  Low energy or trouble sleeping.  Stress may cause unhealthy behaviors, including the following:  Eating to feel better (overeating) or skipping meals.  Sleeping too little,  too much, or both.  Working too much or putting off tasks (procrastination).  Smoking, drinking alcohol, or using drugs to feel better.  How is this diagnosed? Stress is diagnosed through an assessment by your health care provider. Your health care provider will ask questions about your symptoms and any stressful life events.Your health care provider will also ask about your medical history and may order blood tests or other tests. Certain medical conditions and medicine can cause physical symptoms similar to stress. Mental illness can cause emotional symptoms and unhealthy behaviors similar to stress. Your health care provider may refer you to a mental health professional for further evaluation. How is this treated? Stress management is the recommended treatment for stress.The goals of stress management are reducing stressful life events and coping with stress in healthy ways. Techniques for reducing stressful life events include the following:  Stress identification. Self-monitor for stress and identify what causes stress for you. These skills may help you to avoid some stressful events.  Time management. Set your priorities, keep a calendar of events, and learn to say "no." These tools can help you avoid making too many commitments.  Techniques for coping with stress include the following:  Rethinking the problem. Try to think realistically about stressful events rather than ignoring them or overreacting. Try to find the positives in a stressful situation rather than focusing on the negatives.  Exercise. Physical exercise can release both physical and emotional tension. The key is to find a form of exercise you enjoy and do it regularly.  Relaxation techniques. These relax the body and  mind. Examples include yoga, meditation, tai chi, biofeedback, deep breathing, progressive muscle relaxation, listening to music, being out in nature, journaling, and other hobbies. Again, the key is to find  one or more that you enjoy and can do regularly.  Healthy lifestyle. Eat a balanced diet, get plenty of sleep, and do not smoke. Avoid using alcohol or drugs to relax.  Strong support network. Spend time with family, friends, or other people you enjoy being around.Express your feelings and talk things over with someone you trust.  Counseling or talktherapy with a mental health professional may be helpful if you are having difficulty managing stress on your own. Medicine is typically not recommended for the treatment of stress.Talk to your health care provider if you think you need medicine for symptoms of stress. Follow these instructions at home:  Keep all follow-up visits as directed by your health care provider.  Take all medicines as directed by your health care provider. Contact a health care provider if:  Your symptoms get worse or you start having new symptoms.  You feel overwhelmed by your problems and can no longer manage them on your own. Get help right away if:  You feel like hurting yourself or someone else. This information is not intended to replace advice given to you by your health care provider. Make sure you discuss any questions you have with your health care provider. Document Released: 02/02/2001 Document Revised: 01/15/2016 Document Reviewed: 04/03/2013 Elsevier Interactive Patient Education  2017 [employer]  

## 2018-06-12 NOTE — Progress Notes (Signed)
   Subjective:    Patient ID: Stephanie Buckley, female    DOB: 1975-12-19, 42 y.o.   MRN: [id number]   Chief Complaint: Adult ADHD follow up  HPI Patient come sin today for follow up of ADHD. She is currently on adderall XR 20 mg bid. She has been on this for several years. She is unable to concentrate at work without meds. She denei any medication side effects.   Review of Systems  Constitutional: Negative for activity change and appetite change.  HENT: Negative.   Eyes: Negative for pain.  Respiratory: Negative for shortness of breath.   Cardiovascular: Negative for chest pain, palpitations and leg swelling.  Gastrointestinal: Negative for abdominal pain.  Endocrine: Negative for polydipsia.  Genitourinary: Negative.   Skin: Negative for rash.  Neurological: Negative for dizziness, weakness and headaches.  Hematological: Does not bruise/bleed easily.  Psychiatric/Behavioral: Negative.   All other systems reviewed and are negative.      Objective:   Physical Exam  Constitutional: She is oriented to person, place, and time. She appears well-developed and well-nourished. No distress.  Cardiovascular: Normal rate.  Pulmonary/Chest: Effort normal.  Neurological: She is alert and oriented to person, place, and time.  Skin: Skin is warm.  Psychiatric: She has a normal mood and affect. Her behavior is normal. Judgment and thought content normal.    BP 110/79   Pulse 82   Temp 97.8 F (36.6 C)   Ht 5\' 7"  (1.702 m)   Wt 211 lb (95.7 kg)   BMI 33.05 kg/m       Assessment & Plan:  Stephanie Buckley in today with chief complaint of Follow-up   1. ADHD, adult residual type Stress management - amphetamine-dextroamphetamine (ADDERALL XR) 20 MG 24 hr capsule; Take 2 capsules (40 mg total) by mouth daily.  Dispense: 60 capsule; Refill: 0 - amphetamine-dextroamphetamine (ADDERALL XR) 20 MG 24 hr capsule; Take 2 capsules (40 mg total) by mouth daily.  Dispense: 60 capsule; Refill:  0 - amphetamine-dextroamphetamine (ADDERALL XR) 20 MG 24 hr capsule; Take 2 capsules (40 mg total) by mouth daily.  Dispense: 60 capsule; Refill: 0  Mary-Margaret Hassell Done, FNP

## 2018-06-22 ENCOUNTER — Other Ambulatory Visit: Payer: Self-pay | Admitting: Nurse Practitioner

## 2018-06-22 DIAGNOSIS — F908 Attention-deficit hyperactivity disorder, other type: Secondary | ICD-10-CM

## 2018-06-22 MED ORDER — AMPHETAMINE-DEXTROAMPHET ER 20 MG PO CP24: 40.0000 mg | ORAL_CAPSULE | Freq: Every day | ORAL | 0 refills | Status: DC

## 2018-06-26 ENCOUNTER — Encounter: Payer: Self-pay | Admitting: Family Medicine

## 2018-06-26 ENCOUNTER — Ambulatory Visit: Payer: BC Managed Care – PPO | Admitting: Family Medicine

## 2018-06-26 ENCOUNTER — Ambulatory Visit (INDEPENDENT_AMBULATORY_CARE_PROVIDER_SITE_OTHER): Payer: BC Managed Care – PPO

## 2018-06-26 VITALS — BP 112/77 | HR 92 | Temp 97.0°F | Ht 67.0 in | Wt 210.0 lb

## 2018-06-26 DIAGNOSIS — M25511 Pain in right shoulder: Secondary | ICD-10-CM | POA: Diagnosis not present

## 2018-06-26 DIAGNOSIS — W19XXXA Unspecified fall, initial encounter: Secondary | ICD-10-CM

## 2018-06-26 NOTE — Progress Notes (Signed)
Chief Complaint  Patient presents with  . right side neck pain    fall last night     HPI  Patient presents today for her way to bed last night in the dark she tripped over her dog and fell landing on the right shoulder.  She now has pain she points to the proximal portion of the right clavicle.  There is also some pain in the back at the same level.  She has trouble moving her arm.  It is particularly painful for rotation and abduction separately or together.  Flexion and extension to a lesser degree are uncomfortable.  It is also uncomfortable to shrug her shoulders.  PMH: Smoking status noted ROS: Per HPI  Objective: BP 112/77 (BP Location: Left Arm)   Pulse 92   Temp (!) 97 F (36.1 C) (Oral)   Ht 5\' 7"  (1.702 m)   Wt 210 lb (95.3 kg)   LMP 06/26/2018   BMI 32.89 kg/m  Gen: NAD, alert, cooperative with exam HEENT: NCAT, EOMI, PERRL Ext: No edema, warm.  Decreased range of motion for abduction and rotation at the right upper extremity.  There is tenderness along the anterior border of the right clavicle.  Also at the superior margin of the scapula posteriorly on the right.  No edema.  The right upper extremity is neurovascularly intact grossly. Neuro: Alert and oriented, No gross deficits X-ray shoulder and clavicle: No acute changes no fracture noted Assessment and plan:  1. Acute pain of right shoulder   2. Fall, initial encounter     Sling dispensed.  Use that for comfort on the right upper extremity.  Ibuprofen as needed for pain over-the-counter should be sufficient.  Orders Placed This Encounter  Procedures  . DG Shoulder Right    Standing Status:   Future    Number of Occurrences:   1    Standing Expiration Date:   08/26/2019    Order Specific Question:   Reason for Exam (SYMPTOM  OR DIAGNOSIS REQUIRED)    Answer:   right shoulder and neck pain    Order Specific Question:   Is the patient pregnant?    Answer:   No    Order Specific Question:   Preferred imaging  location?    Answer:   Internal  . DG Clavicle Right    Standing Status:   Future    Number of Occurrences:   1    Standing Expiration Date:   08/27/2019    Order Specific Question:   Reason for Exam (SYMPTOM  OR DIAGNOSIS REQUIRED)    Answer:   right shoulder and neck pain / fall    Order Specific Question:   Is patient pregnant?    Answer:   No    Order Specific Question:   Preferred imaging location?    Answer:   Internal    Order Specific Question:   Radiology Contrast Protocol - do NOT remove file path    Answer:   [email or web address]    Follow up as needed should symptoms fail to resolve.  Claretta Fraise, MD

## 2018-09-04 ENCOUNTER — Encounter: Payer: Self-pay | Admitting: Nurse Practitioner

## 2018-09-04 ENCOUNTER — Ambulatory Visit (INDEPENDENT_AMBULATORY_CARE_PROVIDER_SITE_OTHER): Payer: BC Managed Care – PPO

## 2018-09-04 ENCOUNTER — Ambulatory Visit: Payer: BC Managed Care – PPO | Admitting: Nurse Practitioner

## 2018-09-04 VITALS — BP 112/80 | HR 106 | Temp 97.3°F | Ht 67.0 in | Wt 210.0 lb

## 2018-09-04 DIAGNOSIS — M25562 Pain in left knee: Secondary | ICD-10-CM

## 2018-09-04 MED ORDER — PREDNISONE 10 MG (21) PO TBPK: ORAL_TABLET | ORAL | 0 refills | Status: DC

## 2018-09-04 NOTE — Progress Notes (Signed)
   Subjective:    Patient ID: Stephanie Buckley, female    DOB: 12-18-1975, 43 y.o.   MRN: [id number]   Chief Complaint: knee pain and swelling (left)   HPI Patient in today c/o left knee pain. Started a few weeks ago and has gradually gotten worse. Rates pain 1/10 currently but going up and down steps aggravates it. Difficult to gi from sitting to standing. She has noticed some swelling.    Review of Systems  Musculoskeletal: Positive for arthralgias (left knee).  All other systems reviewed and are negative.      Objective:   Physical Exam Constitutional:      General: She is not in acute distress.    Appearance: Normal appearance. She is normal weight.  Cardiovascular:     Rate and Rhythm: Normal rate and regular rhythm.  Pulmonary:     Effort: Pulmonary effort is normal.     Breath sounds: Normal breath sounds.  Musculoskeletal:     Comments: FROM of left knee with pain on full extension Mild left knee effusion No patella tenderness  Neurological:     General: No focal deficit present.     Mental Status: She is alert and oriented to person, place, and time.  Psychiatric:        Mood and Affect: Mood normal.        Behavior: Behavior normal.     BP 112/80   Pulse (!) 106   Temp (!) 97.3 F (36.3 C) (Oral)   Ht 5\' 7"  (1.702 m)   Wt 210 lb (95.3 kg)   BMI 32.89 kg/m   Left knee x ray- mild Paula Libra, FNP      Assessment & Plan:  Stephanie Buckley in today with chief complaint of knee pain and swelling (left)   1. Acute pain of left knee Ice bid Elevate when sitting Wrap when up walking - DG Knee 1-2 Views Left; Future - predniSONE (STERAPRED UNI-PAK 21 TAB) 10 MG (21) TBPK tablet; As directed x 6 days  Dispense: 21 tablet; Refill: 0  Mary-Margaret Hassell Done, FNP

## 2018-09-12 ENCOUNTER — Ambulatory Visit: Payer: BC Managed Care – PPO | Admitting: Nurse Practitioner

## 2018-09-12 ENCOUNTER — Encounter: Payer: Self-pay | Admitting: Nurse Practitioner

## 2018-09-12 VITALS — BP 123/79 | HR 82 | Temp 96.9°F | Ht 67.0 in | Wt 213.8 lb

## 2018-09-12 DIAGNOSIS — F908 Attention-deficit hyperactivity disorder, other type: Secondary | ICD-10-CM

## 2018-09-12 MED ORDER — AMPHETAMINE-DEXTROAMPHET ER 20 MG PO CP24: 40.0000 mg | ORAL_CAPSULE | Freq: Every day | ORAL | 0 refills | Status: DC

## 2018-09-12 NOTE — Progress Notes (Signed)
   Subjective:    Patient ID: Stephanie Buckley, female    DOB: [birth year], 43 y.o.   MRN: [id number]   Chief Complaint: ADHD   HPI:  1. ADHD, adult residual type  Patient is currently on adderall 20mg  daily. She has been taking this for awhile and is not able to get her work done when she does not take meds. She denies any side effects from medication.    Outpatient Encounter Medications as of 09/12/2018  Medication Sig  . amphetamine-dextroamphetamine (ADDERALL XR) 20 MG 24 hr capsule Take 2 capsules (40 mg total) by mouth daily.  Marland Kitchen amphetamine-dextroamphetamine (ADDERALL XR) 20 MG 24 hr capsule Take 2 capsules (40 mg total) by mouth daily. (Patient not taking: Reported on 06/26/2018)  . amphetamine-dextroamphetamine (ADDERALL XR) 20 MG 24 hr capsule Take 2 capsules (40 mg total) by mouth daily.     New complaints: None today  Social history: Lives with husband and her  sons.   Review of Systems  Constitutional: Negative for activity change and appetite change.  HENT: Negative.   Eyes: Negative for pain.  Respiratory: Negative for shortness of breath.   Cardiovascular: Negative for chest pain, palpitations and leg swelling.  Gastrointestinal: Negative for abdominal pain.  Endocrine: Negative for polydipsia.  Genitourinary: Negative.   Skin: Negative for rash.  Neurological: Negative for dizziness, weakness and headaches.  Hematological: Does not bruise/bleed easily.  Psychiatric/Behavioral: Negative.   All other systems reviewed and are negative.      Objective:   Physical Exam Vitals signs and nursing note reviewed.  Constitutional:      Appearance: She is normal weight.  Cardiovascular:     Rate and Rhythm: Normal rate and regular rhythm.     Pulses: Normal pulses.  Pulmonary:     Effort: Pulmonary effort is normal.     Breath sounds: Normal breath sounds.  Skin:    General: Skin is warm and dry.  Neurological:     General: No focal deficit present.   Mental Status: She is alert and oriented to person, place, and time.  Psychiatric:        Mood and Affect: Mood normal.        Behavior: Behavior normal.     BP 123/79   Pulse 82   Temp (!) 96.9 F (36.1 C) (Oral)   Ht 5\' 7"  (1.702 m)   Wt 213 lb 12.8 oz (97 kg)   BMI 33.49 kg/m        Assessment & Plan:  Stephanie Buckley in today with chief complaint of ADHD   1. ADHD, adult residual type Stress management - amphetamine-dextroamphetamine (ADDERALL XR) 20 MG 24 hr capsule; Take 2 capsules (40 mg total) by mouth daily for 30 days.  Dispense: 60 capsule; Refill: 0 - amphetamine-dextroamphetamine (ADDERALL XR) 20 MG 24 hr capsule; Take 2 capsules (40 mg total) by mouth daily for 30 days.  Dispense: 60 capsule; Refill: 0 - amphetamine-dextroamphetamine (ADDERALL XR) 20 MG 24 hr capsule; Take 2 capsules (40 mg total) by mouth daily for 30 days.  Dispense: 60 capsule; Refill: 0  Mary-Margaret Hassell Done, FNP

## 2018-09-12 NOTE — Patient Instructions (Signed)

## 2018-11-01 ENCOUNTER — Encounter: Payer: Self-pay | Admitting: Physician Assistant

## 2018-11-01 ENCOUNTER — Other Ambulatory Visit: Payer: Self-pay

## 2018-11-01 ENCOUNTER — Ambulatory Visit (INDEPENDENT_AMBULATORY_CARE_PROVIDER_SITE_OTHER): Payer: BC Managed Care – PPO | Admitting: Physician Assistant

## 2018-11-01 VITALS — BP 109/71 | HR 110 | Temp 97.7°F | Ht 67.0 in | Wt 215.6 lb

## 2018-11-01 DIAGNOSIS — R52 Pain, unspecified: Secondary | ICD-10-CM | POA: Diagnosis not present

## 2018-11-01 DIAGNOSIS — B349 Viral infection, unspecified: Secondary | ICD-10-CM | POA: Diagnosis not present

## 2018-11-01 DIAGNOSIS — J029 Acute pharyngitis, unspecified: Secondary | ICD-10-CM

## 2018-11-01 LAB — RAPID STREP SCREEN (MED CTR MEBANE ONLY): Strep Gp A Ag, IA W/Reflex: NEGATIVE

## 2018-11-01 LAB — VERITOR FLU A/B WAIVED
Influenza A: NEGATIVE
Influenza B: NEGATIVE

## 2018-11-01 LAB — CULTURE, GROUP A STREP

## 2018-11-01 NOTE — Patient Instructions (Signed)
-   Take meds as prescribed - Use a cool mist humidifier  -Use saline nose sprays frequently -Force fluids -For any cough or congestion  Use plain Mucinex- regular strength or max strength is fine -For fever or aces or pains- take tylenol or ibuprofen. -Throat lozenges if help -New toothbrush in 3 days

## 2018-11-01 NOTE — Progress Notes (Signed)
BP 109/71   Pulse (!) 110   Temp 97.7 F (36.5 C) (Oral)   Ht 5\' 7"  (1.702 m)   Wt 215 lb 9.6 oz (97.8 kg)   BMI 33.77 kg/m    Subjective:    Patient ID: Stephanie Buckley, female    DOB: Oct 23, 1975, 42 y.o.   MRN: [id number]  HPI: Stephanie Buckley is a 43 y.o. female presenting on 11/01/2018 for Cough; Nasal Congestion; Headache; and back ache  This patient has had many days of sore throat and postnasal drainage, headache at times and sinus pressure. There is copious drainage at times. Denies any fever at this time. There has been a history of sinus infections in the past.  There is cough at night. It has become more prevalent in recent days. 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   Past Medical History:  Diagnosis Date  . ADHD (attention deficit hyperactivity disorder)    Relevant past medical, surgical, family and social history reviewed and updated as indicated. Interim medical history since our last visit reviewed. Allergies and medications reviewed and updated. DATA REVIEWED: CHART IN EPIC  Family History reviewed for pertinent findings.  Review of Systems  Constitutional: Positive for chills and fatigue. Negative for activity change, appetite change and fever.  HENT: Positive for congestion, postnasal drip and sore throat.   Eyes: Negative.   Respiratory: Positive for cough. Negative for wheezing.   Cardiovascular: Negative.  Negative for chest pain, palpitations and leg swelling.  Gastrointestinal: Negative.   Genitourinary: Negative.   Musculoskeletal: Negative.   Skin: Negative.   Neurological: Positive for headaches.    Allergies as of 11/01/2018      Reactions   Keflex [cephalexin] Rash      Medication List       Accurate as of November 01, 2018 10:52 AM. Always use your most recent med list.        amphetamine-dextroamphetamine 20 MG 24 hr capsule Commonly known as:  Adderall XR Take 2 capsules (40 mg total) by mouth daily for 30 days.   amphetamine-dextroamphetamine 20 MG 24 hr capsule Commonly known as:  Adderall XR Take 2 capsules (40 mg total) by mouth daily for 30 days.   amphetamine-dextroamphetamine 20 MG 24 hr capsule Commonly known as:  ADDERALL XR Take 2 capsules (40 mg total) by mouth daily for 30 days. Start taking on:  November 10, 2018          Objective:    BP 109/71   Pulse (!) 110   Temp 97.7 F (36.5 C) (Oral)   Ht 5\' 7"  (1.702 m)   Wt 215 lb 9.6 oz (97.8 kg)   BMI 33.77 kg/m   Allergies  Allergen Reactions  . Keflex [Cephalexin] Rash    Wt Readings from Last 3 Encounters:  11/01/18 215 lb 9.6 oz (97.8 kg)  09/12/18 213 lb 12.8 oz (97 kg)  09/04/18 210 lb (95.3 kg)    Physical Exam Vitals signs and nursing note reviewed.  Constitutional:      Appearance: She is well-developed.  HENT:     Head: Normocephalic and atraumatic.     Right Ear: A middle ear effusion is present.     Left Ear: A middle ear effusion is present.     Nose: Mucosal edema present.     Right Sinus: No frontal sinus tenderness.     Left Sinus: No frontal sinus tenderness.     Mouth/Throat:  Pharynx: Posterior oropharyngeal erythema present. No oropharyngeal exudate.     Tonsils: No tonsillar abscesses.  Eyes:     Conjunctiva/sclera: Conjunctivae normal.     Pupils: Pupils are equal, round, and reactive to light.  Neck:     Musculoskeletal: Normal range of motion.  Cardiovascular:     Rate and Rhythm: Normal rate and regular rhythm.     Heart sounds: Normal heart sounds.  Pulmonary:     Effort: Pulmonary effort is normal.     Breath sounds: Normal breath sounds.  Abdominal:     General: Bowel sounds are normal.     Palpations: Abdomen is soft.  Skin:    General: Skin is warm and dry.     Findings: No rash.  Neurological:     Mental Status: She is alert and oriented to person, place, and time.     Deep Tendon Reflexes: Reflexes are normal and symmetric.  Psychiatric:        Behavior: Behavior  normal.        Thought Content: Thought content normal.        Judgment: Judgment normal.         Assessment & Plan:   1. Sore throat - Rapid Strep Screen ([facility] ONLY)  2. Body aches - Veritor Flu A/B Waived  3. Viral illness - Veritor Flu A/B Waived   Continue all other maintenance medications as listed above.  Follow up plan: Return if symptoms worsen or fail to improve.  Educational handout given for [place] PA-C [place] [address]  [place], [place] [address] [phone]   11/01/2018, 10:52 AM

## 2018-12-08 ENCOUNTER — Encounter: Payer: Self-pay | Admitting: Nurse Practitioner

## 2018-12-08 ENCOUNTER — Ambulatory Visit (INDEPENDENT_AMBULATORY_CARE_PROVIDER_SITE_OTHER): Payer: BC Managed Care – PPO | Admitting: Nurse Practitioner

## 2018-12-08 ENCOUNTER — Telehealth: Payer: Self-pay | Admitting: Nurse Practitioner

## 2018-12-08 ENCOUNTER — Other Ambulatory Visit: Payer: Self-pay

## 2018-12-08 DIAGNOSIS — F908 Attention-deficit hyperactivity disorder, other type: Secondary | ICD-10-CM | POA: Diagnosis not present

## 2018-12-08 MED ORDER — AMPHETAMINE-DEXTROAMPHET ER 20 MG PO CP24: 40.0000 mg | ORAL_CAPSULE | Freq: Every day | ORAL | 0 refills | Status: DC

## 2018-12-08 NOTE — Telephone Encounter (Signed)
Please advise 

## 2018-12-08 NOTE — Progress Notes (Signed)
Patient ID: Stephanie Buckley, female   DOB: November 19, 1975, 43 y.o.   MRN: [id number]    Virtual Visit via telephone Note  I connected with@ on 12/08/18 at 10:35AM by video and verified that I am speaking with the correct person using two identifiers. Stephanie Buckley is currently located at home and no one is currently with her during visit. The provider, Mary-Margaret Hassell Done, FNP is located in their office at time of visit.  I discussed the limitations, risks, security and privacy concerns of performing an evaluation and management service by telephone and the availability of in person appointments. I also discussed with the patient that there may be a patient responsible charge related to this service. The patient expressed understanding and agreed to proceed.   History and Present Illness:  Stephanie Buckley in today with chief complaint of ADHD  1. ADHD, adult residual type Patient has been on ADHD meds for many years. 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 She is still taking he rmeds so she can concentrate on what she needs to do.    Review of Systems  Constitutional: Negative for diaphoresis and weight loss.  Eyes: Negative for blurred vision, double vision and pain.  Respiratory: Negative for shortness of breath.   Cardiovascular: Negative for chest pain, palpitations, orthopnea and leg swelling.  Gastrointestinal: Negative for abdominal pain.  Skin: Negative for rash.  Neurological: Negative for dizziness, sensory change, loss of consciousness, weakness and headaches.  Endo/Heme/Allergies: Negative for polydipsia. Does not bruise/bleed easily.  Psychiatric/Behavioral: Negative for memory loss. The patient does not have insomnia.   All other systems reviewed and are negative.      Observations/Objective: Alert and oriented- answers all questions appropriately  Assessment and Plan: Stephanie Buckley calls in today with chief complaint of ADHD  Diagnosis and orders  addressed:  1. ADHD, adult residual type Stress management reviewed - amphetamine-dextroamphetamine (ADDERALL XR) 20 MG 24 hr capsule; Take 2 capsules (40 mg total) by mouth daily for 30 days.  Dispense: 60 capsule; Refill: 0 - amphetamine-dextroamphetamine (ADDERALL XR) 20 MG 24 hr capsule; Take 2 capsules (40 mg total) by mouth daily for 30 days.  Dispense: 60 capsule; Refill: 0 - amphetamine-dextroamphetamine (ADDERALL XR) 20 MG 24 hr capsule; Take 2 capsules (40 mg total) by mouth daily for 30 days.  Dispense: 60 capsule; Refill: 0    Follow up plan: 3 months      I discussed the assessment and treatment plan with the patient. The patient was provided an opportunity to ask questions and all were answered. The patient agreed with the plan and demonstrated an understanding of the instructions.   The patient was advised to call back or seek an in-person evaluation if the symptoms worsen or if the condition fails to improve as anticipated.  The above assessment and management plan was discussed with the patient. The patient verbalized understanding of and has agreed to the management plan. Patient is aware to call the clinic if symptoms persist or worsen. Patient is aware when to return to the clinic for a follow-up visit. Patient educated on when it is appropriate to go to the emergency department.    I provided 8 minutes of face-to-face time during this encounter.    Mary-Margaret Hassell Done, FNP

## 2019-03-06 ENCOUNTER — Other Ambulatory Visit: Payer: Self-pay

## 2019-03-07 ENCOUNTER — Encounter: Payer: Self-pay | Admitting: Nurse Practitioner

## 2019-03-07 ENCOUNTER — Ambulatory Visit (INDEPENDENT_AMBULATORY_CARE_PROVIDER_SITE_OTHER): Payer: BC Managed Care – PPO | Admitting: Nurse Practitioner

## 2019-03-07 DIAGNOSIS — F908 Attention-deficit hyperactivity disorder, other type: Secondary | ICD-10-CM

## 2019-03-07 MED ORDER — AMPHETAMINE-DEXTROAMPHET ER 20 MG PO CP24: 40.0000 mg | ORAL_CAPSULE | Freq: Every day | ORAL | 0 refills | Status: DC

## 2019-03-07 NOTE — Progress Notes (Signed)
   Subjective:    Patient ID: Stephanie Buckley, female    DOB: 13-Aug-1976, 43 y.o.   MRN: [id number]   Chief Complaint: Recheck ADHD   HPI Patient come sin today for follow up of adult ADHD. She is currently on adderall XR 20mg  daily. She has been on this for many years. Is not able to concentrate at work without taking. Denies any side effects from medication. She says that it makes her feel like a normal person. Without it she feels like she is in a fog and cannot focus or get anything done.   Review of Systems  Constitutional: Negative for activity change and appetite change.  HENT: Negative.   Eyes: Negative for pain.  Respiratory: Negative for shortness of breath.   Cardiovascular: Negative for chest pain, palpitations and leg swelling.  Gastrointestinal: Negative for abdominal pain.  Endocrine: Negative for polydipsia.  Genitourinary: Negative.   Skin: Negative for rash.  Neurological: Negative for dizziness, weakness and headaches.  Hematological: Does not bruise/bleed easily.  Psychiatric/Behavioral: Negative.   All other systems reviewed and are negative.      Objective:   Physical Exam Vitals signs and nursing note reviewed.  Constitutional:      Appearance: Normal appearance.  Cardiovascular:     Rate and Rhythm: Normal rate and regular rhythm.     Pulses: Normal pulses.     Heart sounds: Normal heart sounds.  Pulmonary:     Breath sounds: Normal breath sounds.  Skin:    General: Skin is warm.  Neurological:     General: No focal deficit present.     Mental Status: She is alert and oriented to person, place, and time.  Psychiatric:        Mood and Affect: Mood normal.        Behavior: Behavior normal.    BP 104/78   Pulse 91   Temp (!) 97.5 F (36.4 C) (Oral)   Ht 5\' 7"  (1.702 m)   Wt 213 lb (96.6 kg)   BMI 33.36 kg/m         Assessment & Plan:  [name] in today with chief complaint of Recheck ADHD   1. ADHD, adult residual type  Stress management - amphetamine-dextroamphetamine (ADDERALL XR) 20 MG 24 hr capsule; Take 2 capsules (40 mg total) by mouth daily.  Dispense: 60 capsule; Refill: 0 - amphetamine-dextroamphetamine (ADDERALL XR) 20 MG 24 hr capsule; Take 2 capsules (40 mg total) by mouth daily.  Dispense: 60 capsule; Refill: 0 - amphetamine-dextroamphetamine (ADDERALL XR) 20 MG 24 hr capsule; Take 2 capsules (40 mg total) by mouth daily.  Dispense: 60 capsule; Refill: 0   Mary-Margaret Hassell Done, FNP

## 2019-03-15 ENCOUNTER — Ambulatory Visit: Payer: BC Managed Care – PPO | Admitting: Nurse Practitioner

## 2019-05-30 ENCOUNTER — Other Ambulatory Visit: Payer: Self-pay

## 2019-05-31 ENCOUNTER — Ambulatory Visit: Payer: BC Managed Care – PPO | Admitting: Nurse Practitioner

## 2019-05-31 ENCOUNTER — Ambulatory Visit: Payer: Self-pay | Admitting: Nurse Practitioner

## 2019-05-31 ENCOUNTER — Encounter: Payer: Self-pay | Admitting: Nurse Practitioner

## 2019-05-31 VITALS — BP 119/80 | HR 102 | Temp 97.3°F | Resp 16 | Ht 67.0 in | Wt 219.0 lb

## 2019-05-31 DIAGNOSIS — F908 Attention-deficit hyperactivity disorder, other type: Secondary | ICD-10-CM | POA: Diagnosis not present

## 2019-05-31 MED ORDER — AMPHETAMINE-DEXTROAMPHET ER 20 MG PO CP24: 40.0000 mg | ORAL_CAPSULE | Freq: Every day | ORAL | 0 refills | Status: DC

## 2019-05-31 NOTE — Progress Notes (Signed)
   Subjective:    Patient ID: Stephanie Buckley, female    DOB: 1976-07-25, 43 y.o.   MRN: [id number]   Chief Complaint: Recheck ADHD   HPI Patient comes in today for recheck of ADHD. She has been on meds since she was a teenager. She is not able to concentrate a work without meds. She is currently n adderall XR 20mg  and takes 2 a day. She is doing well, without side effects.   Review of Systems  Constitutional: Negative for activity change and appetite change.  HENT: Negative.   Eyes: Negative for pain.  Respiratory: Negative for shortness of breath.   Cardiovascular: Negative for chest pain, palpitations and leg swelling.  Gastrointestinal: Negative for abdominal pain.  Endocrine: Negative for polydipsia.  Genitourinary: Negative.   Skin: Negative for rash.  Neurological: Negative for dizziness, weakness and headaches.  Hematological: Does not bruise/bleed easily.  Psychiatric/Behavioral: Negative.   All other systems reviewed and are negative.      Objective:   Physical Exam Vitals signs and nursing note reviewed.  Constitutional:      Appearance: Normal appearance.  Cardiovascular:     Rate and Rhythm: Normal rate and regular rhythm.  Pulmonary:     Effort: Pulmonary effort is normal.     Breath sounds: Normal breath sounds.  Skin:    General: Skin is warm.  Neurological:     General: No focal deficit present.     Mental Status: She is alert and oriented to person, place, and time.  Psychiatric:        Mood and Affect: Mood normal.        Behavior: Behavior normal.    BP 119/80   Pulse (!) 102   Temp (!) 97.3 F (36.3 C) (Temporal)   Resp 16   Ht 5\' 7"  (1.702 m)   Wt 219 lb (99.3 kg)   SpO2 97%   BMI 34.30 kg/m         Assessment & Plan:  Stephanie Buckley in today with chief complaint of Recheck ADHD   1. ADHD, adult residual type stress management - amphetamine-dextroamphetamine (ADDERALL XR) 20 MG 24 hr capsule; Take 2 capsules (40 mg total) by  mouth daily.  Dispense: 60 capsule; Refill: 0 - amphetamine-dextroamphetamine (ADDERALL XR) 20 MG 24 hr capsule; Take 2 capsules (40 mg total) by mouth daily.  Dispense: 60 capsule; Refill: 0 - amphetamine-dextroamphetamine (ADDERALL XR) 20 MG 24 hr capsule; Take 2 capsules (40 mg total) by mouth daily.  Dispense: 60 capsule; Refill: 0  Mary-Margaret Hassell Done, FNP

## 2019-06-14 ENCOUNTER — Ambulatory Visit: Payer: Self-pay | Admitting: Nurse Practitioner

## 2019-07-11 IMAGING — DX DG SHOULDER 2+V*R*
3 series · 3 of 3 positions shown · non-contrast
Comparison: None.

CLINICAL DATA: Fall

EXAM:
RIGHT SHOULDER - 2+ VIEW

[shoulder ap]
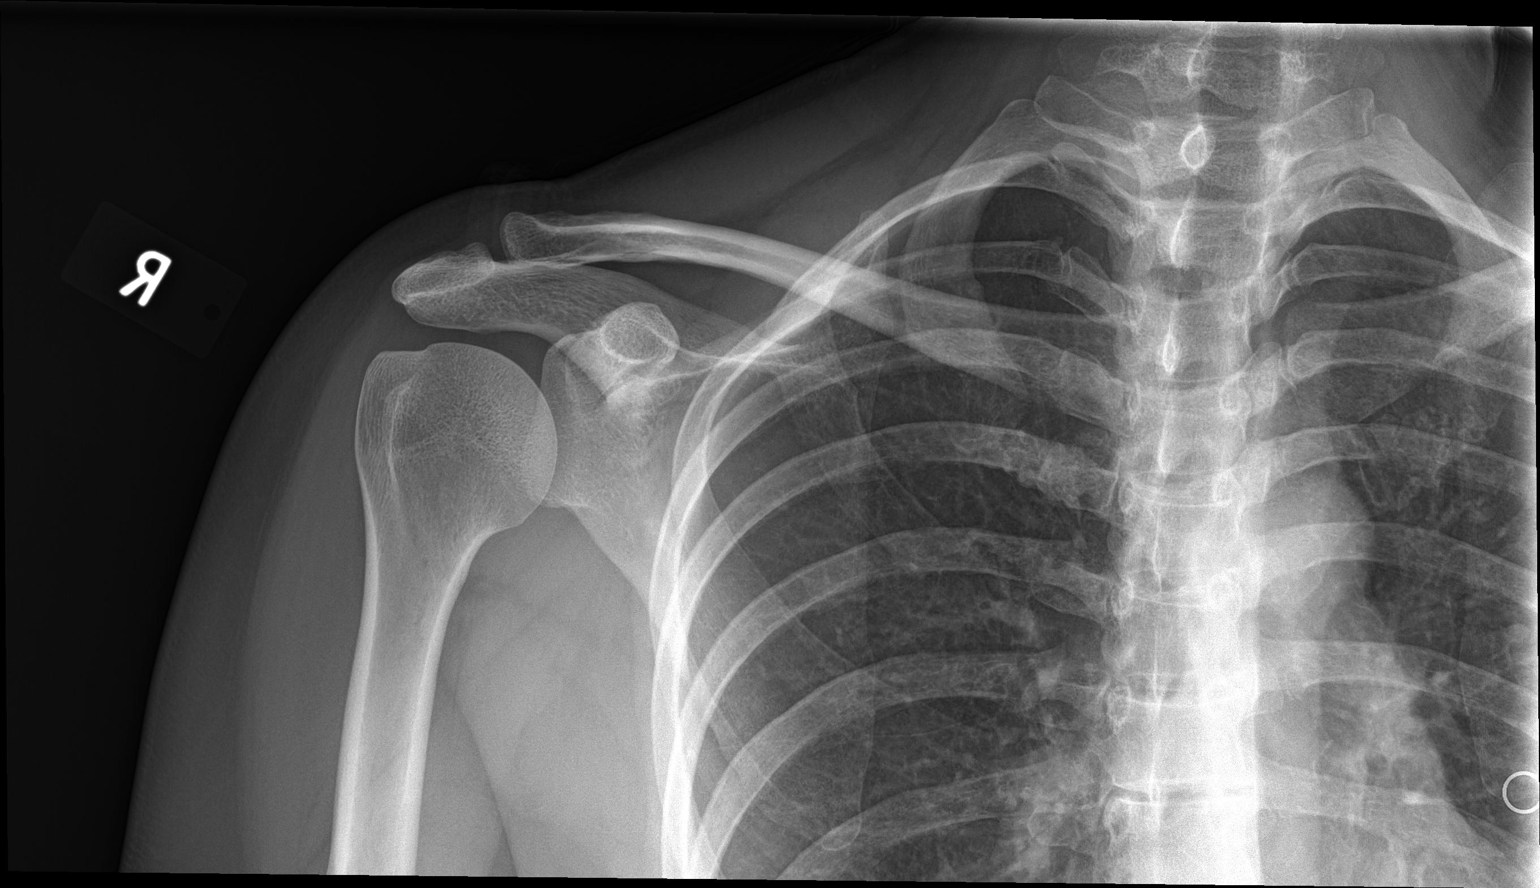

[shoulder obl]
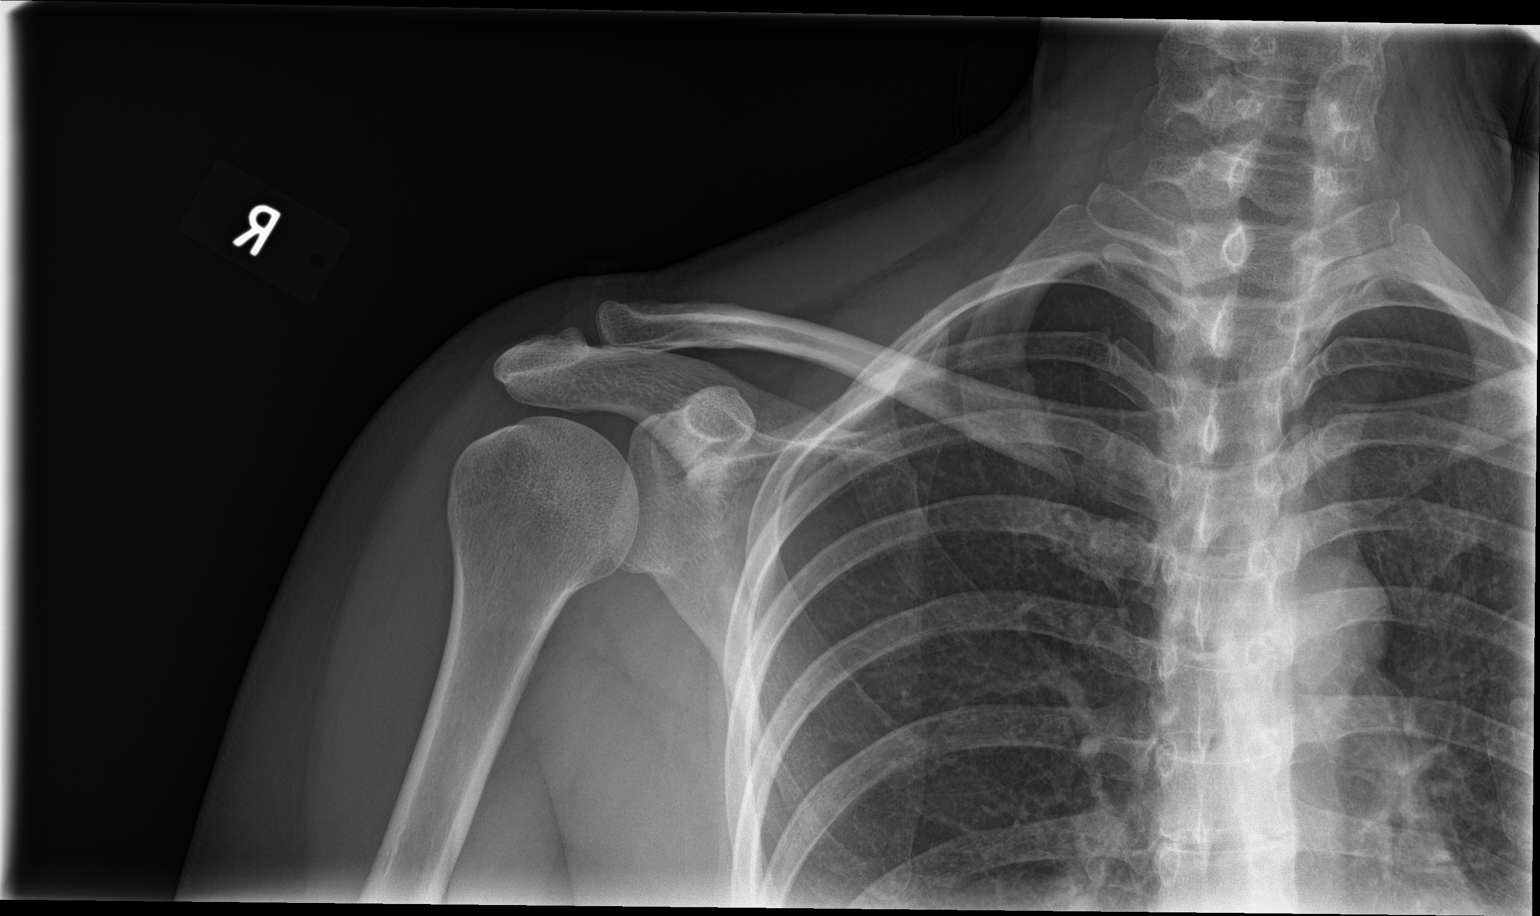

[shoulder axial]
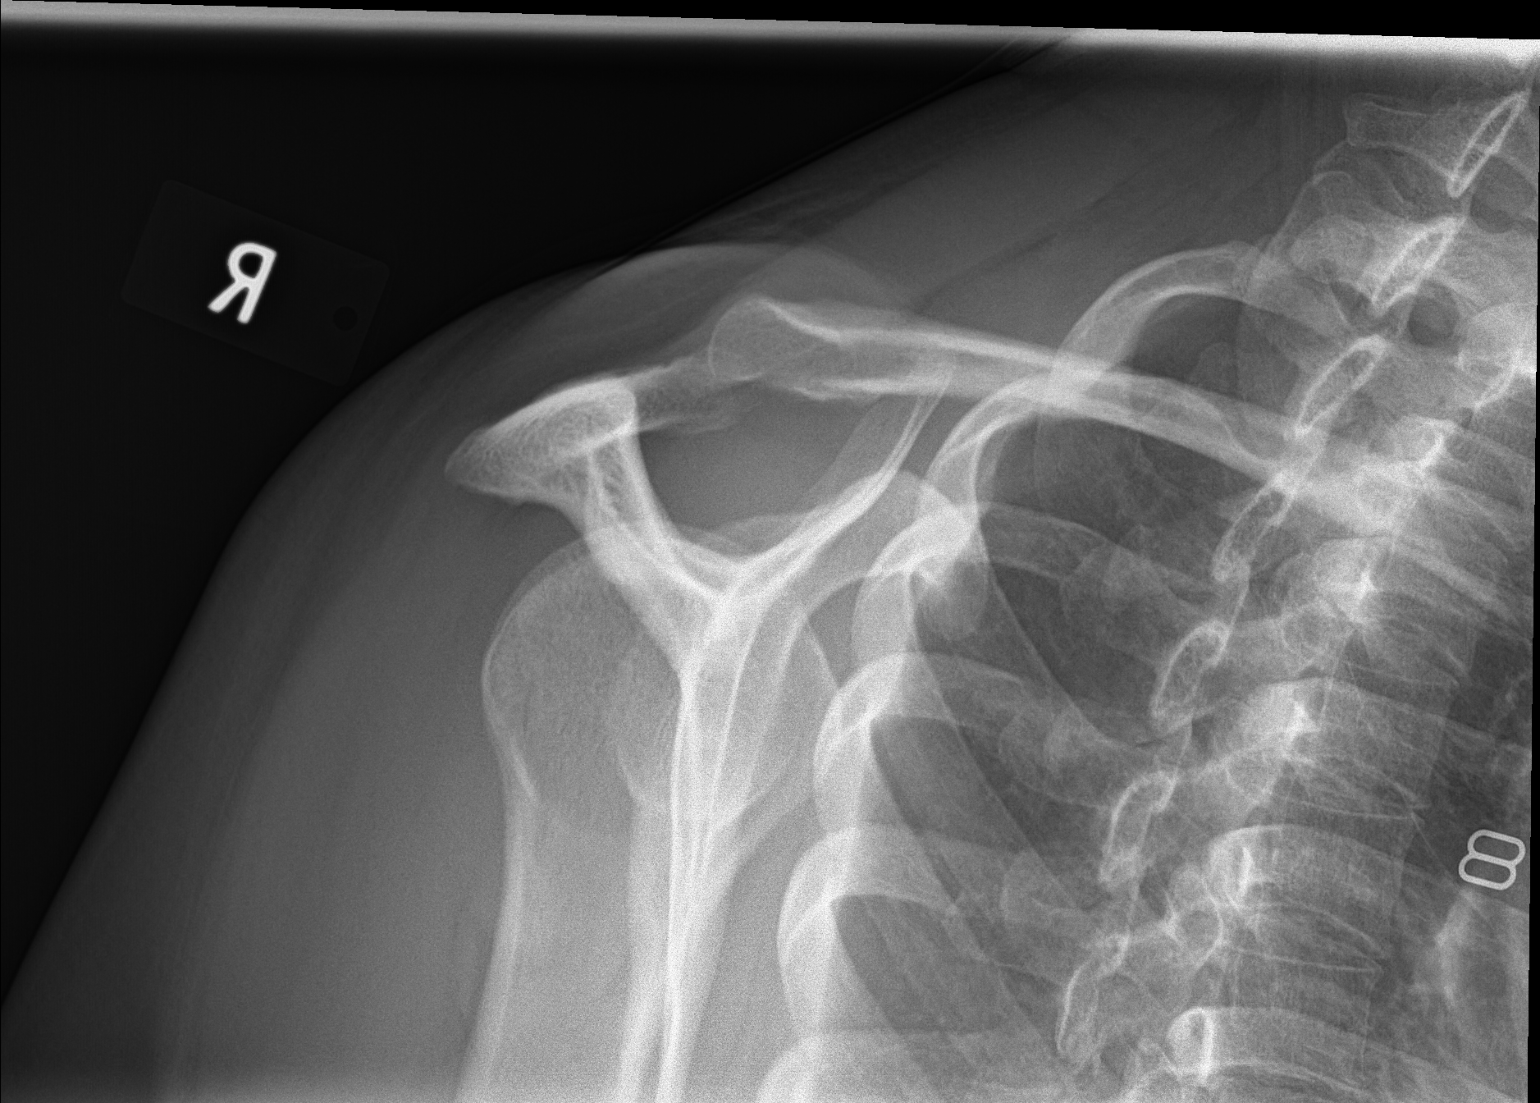

[3 of 3 positions shown; findings below may reference images not displayed]

FINDINGS: No acute fracture. No dislocation.  Unremarkable soft tissues.
IMPRESSION: No acute bony pathology.

## 2019-07-11 IMAGING — DX DG CLAVICLE*R*
2 series · 2 of 2 positions shown · non-contrast
Comparison: None.

CLINICAL DATA: Fall

EXAM:
RIGHT CLAVICLE - 2+ VIEWS

[clavicle ap]
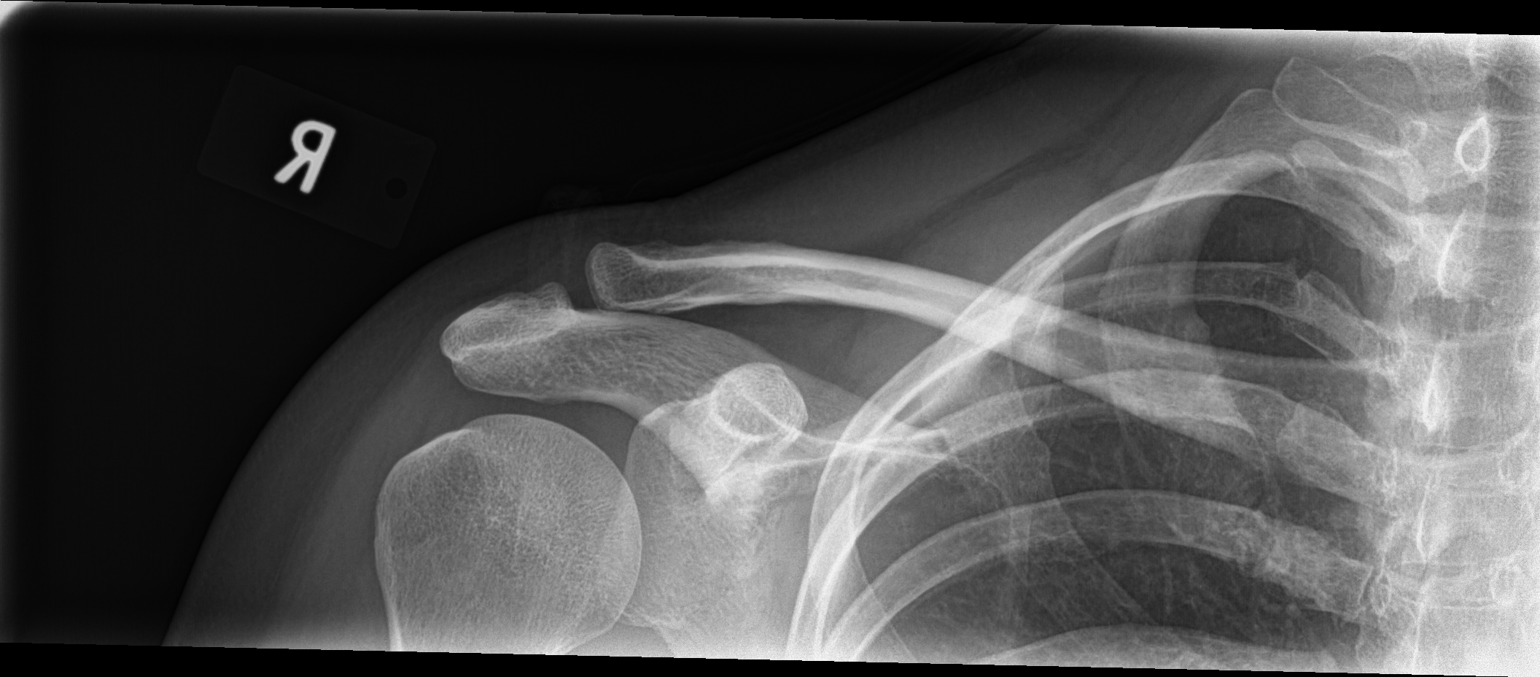

[clavicle axial]
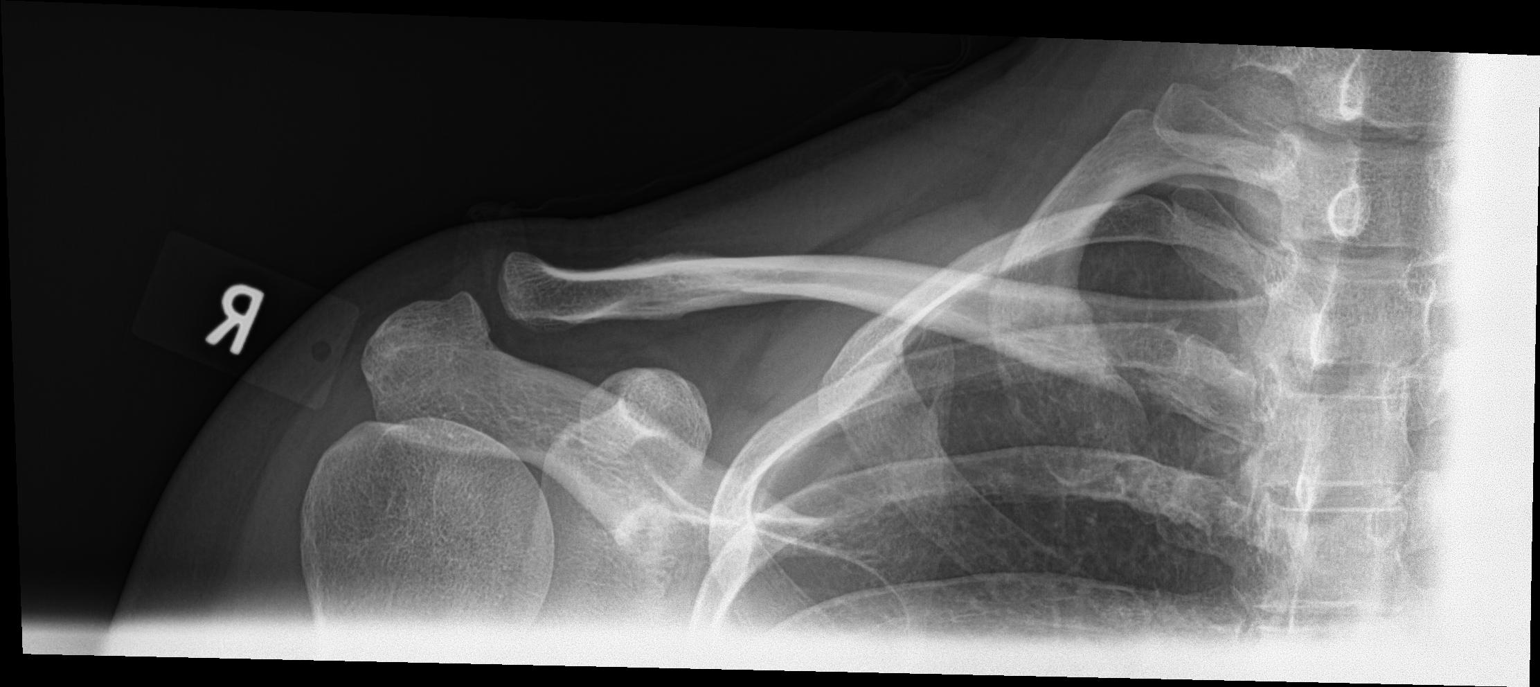

[2 of 2 positions shown; findings below may reference images not displayed]

FINDINGS: There is no evidence of fracture or other focal bone lesions. Soft
tissues are unremarkable.
IMPRESSION: No acute bony pathology.

## 2019-09-03 ENCOUNTER — Other Ambulatory Visit: Payer: Self-pay

## 2019-09-04 ENCOUNTER — Encounter: Payer: Self-pay | Admitting: Nurse Practitioner

## 2019-09-04 ENCOUNTER — Other Ambulatory Visit: Payer: Self-pay

## 2019-09-04 ENCOUNTER — Ambulatory Visit (INDEPENDENT_AMBULATORY_CARE_PROVIDER_SITE_OTHER): Payer: BC Managed Care – PPO | Admitting: Nurse Practitioner

## 2019-09-04 VITALS — BP 115/84 | HR 95 | Temp 98.4°F | Resp 20 | Ht 67.0 in | Wt 226.0 lb

## 2019-09-04 DIAGNOSIS — F908 Attention-deficit hyperactivity disorder, other type: Secondary | ICD-10-CM | POA: Diagnosis not present

## 2019-09-04 MED ORDER — AMPHETAMINE-DEXTROAMPHET ER 20 MG PO CP24: 40.0000 mg | ORAL_CAPSULE | Freq: Every day | ORAL | 0 refills | Status: DC

## 2019-09-04 NOTE — Progress Notes (Signed)
Subjective:    Patient ID: Stephanie Buckley, female    DOB: 04-Oct-1975, 44 y.o.   MRN: [id number]   Chief Complaint: Recheck ADHD    HPI:  1. ADHD, adult residual type Patient coe sin today for follow up of ADHD. She has been on meds for years. 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 She I on adderallXR 20mg  daily. She has no side effects from medication.   Outpatient Encounter Medications as of 09/04/2019  Medication Sig  . amphetamine-dextroamphetamine (ADDERALL XR) 20 MG 24 hr capsule Take 2 capsules (40 mg total) by mouth daily.  Marland Kitchen amphetamine-dextroamphetamine (ADDERALL XR) 20 MG 24 hr capsule Take 2 capsules (40 mg total) by mouth daily.  Marland Kitchen amphetamine-dextroamphetamine (ADDERALL XR) 20 MG 24 hr capsule Take 2 capsules (40 mg total) by mouth daily.    History reviewed. No pertinent surgical history.  Family History  Problem Relation Age of Onset  . Bipolar disorder Father     New complaints: None today  Social history: Lives with her husband ,son and daughter  Controlled substance contract: 03/12/19   Review of Systems  Constitutional: Negative for diaphoresis.  Eyes: Negative for pain.  Respiratory: Negative for shortness of breath.   Cardiovascular: Negative for chest pain, palpitations and leg swelling.  Gastrointestinal: Negative for abdominal pain.  Endocrine: Negative for polydipsia.  Skin: Negative for rash.  Neurological: Negative for dizziness, weakness and headaches.  Hematological: Does not bruise/bleed easily.  All other systems reviewed and are negative.      Objective:   Physical Exam Vitals and nursing note reviewed.  Constitutional:      General: She is not in acute distress.    Appearance: Normal appearance. She is well-developed.  HENT:     Head: Normocephalic.     Nose: Nose normal.  Eyes:     Pupils: Pupils are equal, round, and reactive to light.  Neck:     Vascular: No carotid bruit or JVD.    Cardiovascular:     Rate and Rhythm: Normal rate and regular rhythm.     Heart sounds: Normal heart sounds.  Pulmonary:     Effort: Pulmonary effort is normal. No respiratory distress.     Breath sounds: Normal breath sounds. No wheezing or rales.  Chest:     Chest wall: No tenderness.  Abdominal:     General: Bowel sounds are normal. There is no distension or abdominal bruit.     Palpations: Abdomen is soft. There is no hepatomegaly, splenomegaly, mass or pulsatile mass.     Tenderness: There is no abdominal tenderness.  Musculoskeletal:        General: Normal range of motion.     Cervical back: Normal range of motion and neck supple.  Lymphadenopathy:     Cervical: No cervical adenopathy.  Skin:    General: Skin is warm and dry.  Neurological:     Mental Status: She is alert and oriented to person, place, and time.     Deep Tendon Reflexes: Reflexes are normal and symmetric.  Psychiatric:        Behavior: Behavior normal.        Thought Content: Thought content normal.        Judgment: Judgment normal.    BP 115/84   Pulse 95   Temp 98.4 F (36.9 C) (Temporal)   Resp 20   Ht 5\' 7"  (1.702 m)   Wt 226 lb (102.5 kg)  SpO2 99%   BMI 35.40 kg/m        Assessment & Plan:  Stephanie Buckley in today with chief complaint of Recheck ADHD   1. ADHD, adult residual type Stress management - amphetamine-dextroamphetamine (ADDERALL XR) 20 MG 24 hr capsule; Take 2 capsules (40 mg total) by mouth daily.  Dispense: 60 capsule; Refill: 0 - amphetamine-dextroamphetamine (ADDERALL XR) 20 MG 24 hr capsule; Take 2 capsules (40 mg total) by mouth daily.  Dispense: 60 capsule; Refill: 0 - amphetamine-dextroamphetamine (ADDERALL XR) 20 MG 24 hr capsule; Take 2 capsules (40 mg total) by mouth daily.  Dispense: 60 capsule; Refill: 0  Mary-Margaret Hassell Done, FNP

## 2019-09-26 ENCOUNTER — Telehealth: Payer: BC Managed Care – PPO | Admitting: Nurse Practitioner

## 2019-09-26 DIAGNOSIS — H1031 Unspecified acute conjunctivitis, right eye: Secondary | ICD-10-CM

## 2019-09-26 MED ORDER — POLYMYXIN B-TRIMETHOPRIM 10000-0.1 UNIT/ML-% OP SOLN: 2.0000 [drp] | OPHTHALMIC | 0 refills | Status: DC

## 2019-09-26 NOTE — Progress Notes (Signed)
E-Visit for Pink Eye   We are sorry that you are not feeling well.  Here is how we plan to help!  Based on what you have shared with me it looks like you have conjunctivitis.  Conjunctivitis is a common inflammatory or infectious condition of the eye that is often referred to as "pink eye".  In most cases it is contagious (viral or bacterial). However, not all conjunctivitis requires antibiotics (ex. Allergic).  We have made appropriate suggestions for you based upon your presentation.  I have prescribed Polytrim Ophthalmic drops 1-2 drops 4 times a day times 5 days  Pink eye can be highly contagious.  It is typically spread through direct contact with secretions, or contaminated objects or surfaces that one may have touched.  Strict handwashing is suggested with soap and water is urged.  If not available, use alcohol based had sanitizer.  Avoid unnecessary touching of the eye.  If you wear contact lenses, you will need to refrain from wearing them until you see no white discharge from the eye for at least 24 hours after being on medication.  You should see symptom improvement in 1-2 days after starting the medication regimen.  Call us if symptoms are not improved in 1-2 days.  Home Care:  Wash your hands often!  Do not wear your contacts until you complete your treatment plan.  Avoid sharing towels, bed linen, personal items with a person who has pink eye.  See attention for anyone in your home with similar symptoms.  Get Help Right Away If:  Your symptoms do not improve.  You develop blurred or loss of vision.  Your symptoms worsen (increased discharge, pain or redness)  Your e-visit answers were reviewed by a board certified advanced clinical practitioner to complete your personal care plan.  Depending on the condition, your plan could have included both over the counter or prescription medications.  If there is a problem please reply  once you have received a response from your  provider.  Your safety is important to us.  If you have drug allergies check your prescription carefully.    You can use MyChart to ask questions about today's visit, request a non-urgent call back, or ask for a work or school excuse for 24 hours related to this e-Visit. If it has been greater than 24 hours you will need to follow up with your provider, or enter a new e-Visit to address those concerns.   You will get an e-mail in the next two days asking about your experience.  I hope that your e-visit has been valuable and will speed your recovery. Thank you for using e-visits.   5-10 minutes spent reviewing and documenting in chart.    

## 2019-10-08 ENCOUNTER — Telehealth: Payer: Self-pay | Admitting: *Deleted

## 2019-10-08 NOTE — Telephone Encounter (Signed)
Prior Auth for Amphetamine-Dextroamphet ER 20MG  er capsules-In process  Key: [id number] - Rx #: [id number]    Your demographic data has been sent to the plan successfully. They will respond with your clinical questions and you will be notified by email when available within the next business day. You can also check for an update later by opening this request from your dashboard. Please do not fax or call the plan to resubmit this request.

## 2019-10-16 NOTE — Telephone Encounter (Signed)
Prior Auth for amphetamine-dextroamphet ER20mg  caps-APPROVED till 10/10/2022  PA # [id number]  Pharmacy notified

## 2019-12-04 ENCOUNTER — Ambulatory Visit (INDEPENDENT_AMBULATORY_CARE_PROVIDER_SITE_OTHER): Payer: BC Managed Care – PPO | Admitting: Nurse Practitioner

## 2019-12-04 ENCOUNTER — Encounter: Payer: Self-pay | Admitting: Nurse Practitioner

## 2019-12-04 DIAGNOSIS — F908 Attention-deficit hyperactivity disorder, other type: Secondary | ICD-10-CM

## 2019-12-04 MED ORDER — AMPHETAMINE-DEXTROAMPHET ER 20 MG PO CP24: 40.0000 mg | ORAL_CAPSULE | Freq: Every day | ORAL | 0 refills | Status: DC

## 2019-12-04 NOTE — Progress Notes (Signed)
Virtual Visit via telephone Note Due to COVID-19 pandemic this visit was conducted virtually. This visit type was conducted due to national recommendations for restrictions regarding the COVID-19 Pandemic (e.g. social distancing, sheltering in place) in an effort to limit this patient's exposure and mitigate transmission in our community. All issues noted in this document were discussed and addressed.  A physical exam was not performed with this format.  I connected with Stephanie Buckley on 12/04/19 at 3:40 by telephone and verified that I am speaking with the correct person using two identifiers. Stephanie Buckley is currently located at home and no one is currently with  her during visit. The provider, Mary-Margaret Hassell Done, FNP is located in their office at time of visit.  I discussed the limitations, risks, security and privacy concerns of performing an evaluation and management service by telephone and the availability of in person appointments. I also discussed with the patient that there may be a patient responsible charge related to this service. The patient expressed understanding and agreed to proceed.   History and Present Illness:  Patient calls in for telephone visit for refill of adhd meds. She has been on these for many years. Is not able to concentrate at work without taking. She is currently on adderall XR 20mg  2 daily. She usually takes it on the weekends as well. She denies any medication side effects or problems.   Review of Systems  Constitutional: Negative for diaphoresis and weight loss.  Eyes: Negative for blurred vision, double vision and pain.  Respiratory: Negative for shortness of breath.   Cardiovascular: Negative for chest pain, palpitations, orthopnea and leg swelling.  Gastrointestinal: Negative for abdominal pain.  Skin: Negative for rash.  Neurological: Negative for dizziness, sensory change, loss of consciousness, weakness and headaches.  Endo/Heme/Allergies:  Negative for polydipsia. Does not bruise/bleed easily.  Psychiatric/Behavioral: Negative for memory loss. The patient does not have insomnia.   All other systems reviewed and are negative.    Observations/Objective: Alert and oriented- answers all questions appropriately No distress    Assessment and Plan: [name] in today with chief complaint of No chief complaint on file.   1. ADHD, adult residual type Stress management encouraged - amphetamine-dextroamphetamine (ADDERALL XR) 20 MG 24 hr capsule; Take 2 capsules (40 mg total) by mouth daily.  Dispense: 60 capsule; Refill: 0 - amphetamine-dextroamphetamine (ADDERALL XR) 20 MG 24 hr capsule; Take 2 capsules (40 mg total) by mouth daily.  Dispense: 60 capsule; Refill: 0 - amphetamine-dextroamphetamine (ADDERALL XR) 20 MG 24 hr capsule; Take 2 capsules (40 mg total) by mouth daily.  Dispense: 60 capsule; Refill: 0     Follow Up Instructions: 3 months    I discussed the assessment and treatment plan with the patient. The patient was provided an opportunity to ask questions and all were answered. The patient agreed with the plan and demonstrated an understanding of the instructions.   The patient was advised to call back or seek an in-person evaluation if the symptoms worsen or if the condition fails to improve as anticipated.  The above assessment and management plan was discussed with the patient. The patient verbalized understanding of and has agreed to the management plan. Patient is aware to call the clinic if symptoms persist or worsen. Patient is aware when to return to the clinic for a follow-up visit. Patient educated on when it is appropriate to go to the emergency department.   Time call ended:  4:00  I provided  10 minutes of non-face-to-face time during this encounter.    Mary-Margaret Hassell Done, FNP

## 2020-02-29 ENCOUNTER — Encounter: Payer: Self-pay | Admitting: Nurse Practitioner

## 2020-02-29 ENCOUNTER — Ambulatory Visit: Payer: BC Managed Care – PPO | Admitting: Nurse Practitioner

## 2020-02-29 ENCOUNTER — Other Ambulatory Visit: Payer: Self-pay

## 2020-02-29 DIAGNOSIS — F908 Attention-deficit hyperactivity disorder, other type: Secondary | ICD-10-CM

## 2020-02-29 MED ORDER — AMPHETAMINE-DEXTROAMPHET ER 20 MG PO CP24: 40.0000 mg | ORAL_CAPSULE | Freq: Every day | ORAL | 0 refills | Status: DC

## 2020-02-29 NOTE — Progress Notes (Signed)
   Subjective:    Patient ID: Stephanie Buckley, female    DOB: [birth year], 44 y.o.   MRN: [id number]   Chief Complaint: Recheck ADHD   HPI Patient comes in today for refill of ADHD meds. She has been on something for many years. She is not able to concentrate at work without meds. She denies any side effects. No weight loss. No problems sleeping.   Review of Systems  Constitutional: Negative for diaphoresis.  Eyes: Negative for pain.  Respiratory: Negative for shortness of breath.   Cardiovascular: Negative for chest pain, palpitations and leg swelling.  Gastrointestinal: Negative for abdominal pain.  Endocrine: Negative for polydipsia.  Skin: Negative for rash.  Neurological: Negative for dizziness, weakness and headaches.  Hematological: Does not bruise/bleed easily.  All other systems reviewed and are negative.      Objective:   Physical Exam Vitals and nursing note reviewed.  Constitutional:      Appearance: Normal appearance.  Cardiovascular:     Rate and Rhythm: Normal rate and regular rhythm.     Heart sounds: Normal heart sounds.  Pulmonary:     Breath sounds: Normal breath sounds.  Skin:    General: Skin is warm and dry.  Neurological:     General: No focal deficit present.     Mental Status: She is alert and oriented to person, place, and time.  Psychiatric:        Mood and Affect: Mood normal.        Behavior: Behavior normal.    BP 111/80   Pulse 93   Temp 98.1 F (36.7 C) (Temporal)   Resp 20   Ht 5\' 7"  (1.702 m)   Wt 234 lb (106.1 kg)   SpO2 94%   BMI 36.65 kg/m         Assessment & Plan:  [name] in today with chief complaint of Recheck ADHD   1. ADHD, adult residual type Stress management - amphetamine-dextroamphetamine (ADDERALL XR) 20 MG 24 hr capsule; Take 2 capsules (40 mg total) by mouth daily.  Dispense: 60 capsule; Refill: 0 - amphetamine-dextroamphetamine (ADDERALL XR) 20 MG 24 hr capsule; Take 2 capsules (40 mg total) by  mouth daily.  Dispense: 60 capsule; Refill: 0 - amphetamine-dextroamphetamine (ADDERALL XR) 20 MG 24 hr capsule; Take 2 capsules (40 mg total) by mouth daily.  Dispense: 60 capsule; Refill: 0    The above assessment and management plan was discussed with the patient. The patient verbalized understanding of and has agreed to the management plan. Patient is aware to call the clinic if symptoms persist or worsen. Patient is aware when to return to the clinic for a follow-up visit. Patient educated on when it is appropriate to go to the emergency department.   Mary-Margaret Hassell Done, FNP

## 2020-02-29 NOTE — Patient Instructions (Signed)
Stress, Adult Stress is a normal reaction to life events. Stress is what you feel when life demands more than you are used to, or more than you think you can handle. Some stress can be useful, such as studying for a test or meeting a deadline at work. Stress that occurs too often or for too long can cause problems. It can affect your emotional health and interfere with relationships and normal daily activities. Too much stress can weaken your body's defense system (immune system) and increase your risk for physical illness. If you already have a medical problem, stress can make it worse. What are the causes? All sorts of life events can cause stress. An event that causes stress for one person may not be stressful for another person. Major life events, whether positive or negative, commonly cause stress. Examples include:  Losing a job or starting a new job.  Losing a loved one.  Moving to a new town or home.  Getting married or divorced.  Having a baby.  Getting injured or sick. Less obvious life events can also cause stress, especially if they occur day after day or in combination with each other. Examples include:  Working long hours.  Driving in traffic.  Caring for children.  Being in debt.  Being in a difficult relationship. What are the signs or symptoms? Stress can cause emotional symptoms, including:  Anxiety. This is feeling worried, afraid, on edge, overwhelmed, or out of control.  Anger, including irritation or impatience.  Depression. This is feeling sad, down, helpless, or guilty.  Trouble focusing, remembering, or making decisions. Stress can cause physical symptoms, including:  Aches and pains. These may affect your head, neck, back, stomach, or other areas of your body.  Tight muscles or a clenched jaw.  Low energy.  Trouble sleeping. Stress can cause unhealthy behaviors, including:  Eating to feel better (overeating) or skipping meals.  Working too  much or putting off tasks.  Smoking, drinking alcohol, or using drugs to feel better. How is this diagnosed? Stress is diagnosed through an assessment by your health care provider. He or she may diagnose this condition based on:  Your symptoms and any stressful life events.  Your medical history.  Tests to rule out other causes of your symptoms. Depending on your condition, your health care provider may refer you to a specialist for further evaluation. How is this treated?  Stress management techniques are the recommended treatment for stress. Medicine is not typically recommended for the treatment of stress. Techniques to reduce your reaction to stressful life events include:  Stress identification. Monitor yourself for symptoms of stress and identify what causes stress for you. These skills may help you to avoid or prepare for stressful events.  Time management. Set your priorities, keep a calendar of events, and learn to say no. Taking these actions can help you avoid making too many commitments. Techniques for coping with stress include:  Rethinking the problem. Try to think realistically about stressful events rather than ignoring them or overreacting. Try to find the positives in a stressful situation rather than focusing on the negatives.  Exercise. Physical exercise can release both physical and emotional tension. The key is to find a form of exercise that you enjoy and do it regularly.  Relaxation techniques. These relax the body and mind. The key is to find one or more that you enjoy and use the techniques regularly. Examples include: ? Meditation, deep breathing, or progressive relaxation techniques. ? Yoga or   tai chi. ? Biofeedback, mindfulness techniques, or journaling. ? Listening to music, being out in nature, or participating in other hobbies.  Practicing a healthy lifestyle. Eat a balanced diet, drink plenty of water, limit or avoid caffeine, and get plenty of  sleep.  Having a strong support network. Spend time with family, friends, or other people you enjoy being around. Express your feelings and talk things over with someone you trust. Counseling or talk therapy with a mental health professional may be helpful if you are having trouble managing stress on your own. Follow these instructions at home: Lifestyle   Avoid drugs.  Do not use any products that contain nicotine or tobacco, such as cigarettes, e-cigarettes, and chewing tobacco. If you need help quitting, ask your health care provider.  Limit alcohol intake to no more than 1 drink a day for nonpregnant women and 2 drinks a day for men. One drink equals 12 oz of beer, 5 oz of wine, or 1 oz of hard liquor  Do not use alcohol or drugs to relax.  Eat a balanced diet that includes fresh fruits and vegetables, whole grains, lean meats, fish, eggs, and beans, and low-fat dairy. Avoid processed foods and foods high in added fat, sugar, and salt.  Exercise at least 30 minutes on 5 or more days each week.  Get 7-8 hours of sleep each night. General instructions   Practice stress management techniques as discussed with your health care provider.  Drink enough fluid to keep your urine clear or pale yellow.  Take over-the-counter and prescription medicines only as told by your health care provider.  Keep all follow-up visits as told by your health care provider. This is important. Contact a health care provider if:  Your symptoms get worse.  You have new symptoms.  You feel overwhelmed by your problems and can no longer manage them on your own. Get help right away if:  You have thoughts of hurting yourself or others. If you ever feel like you may hurt yourself or others, or have thoughts about taking your own life, get help right away. You can go to your nearest emergency department or call:  Your local emergency services (911 in the [place]  A suicide crisis helpline, such as the  [place] at (316) 250-6172. This is open 24 hours a day. Summary  Stress is a normal reaction to life events. It can cause problems if it happens too often or for too long.  Practicing stress management techniques is the best way to treat stress.  Counseling or talk therapy with a mental health professional may be helpful if you are having trouble managing stress on your own. This information is not intended to replace advice given to you by your health care provider. Make sure you discuss any questions you have with your health care provider. Document Revised: 03/09/2019 Document Reviewed: 09/29/2016 Elsevier Patient Education  [place].

## 2020-04-08 ENCOUNTER — Encounter: Payer: Self-pay | Admitting: Nurse Practitioner

## 2020-05-26 ENCOUNTER — Encounter: Payer: Self-pay | Admitting: Nurse Practitioner

## 2020-05-26 NOTE — Telephone Encounter (Signed)
Please write patient another note about not being able to wear a mask due to claustrophobia. Wrot eone last [employer]- you can just copy and reprint.

## 2020-05-27 ENCOUNTER — Encounter: Payer: Self-pay | Admitting: Nurse Practitioner

## 2020-05-27 ENCOUNTER — Ambulatory Visit (INDEPENDENT_AMBULATORY_CARE_PROVIDER_SITE_OTHER): Payer: BC Managed Care – PPO | Admitting: Nurse Practitioner

## 2020-05-27 DIAGNOSIS — F908 Attention-deficit hyperactivity disorder, other type: Secondary | ICD-10-CM | POA: Diagnosis not present

## 2020-05-27 MED ORDER — AMPHETAMINE-DEXTROAMPHET ER 20 MG PO CP24: 40.0000 mg | ORAL_CAPSULE | Freq: Every day | ORAL | 0 refills | Status: DC

## 2020-05-27 NOTE — Progress Notes (Signed)
Virtual Visit via telephone Note Due to COVID-19 pandemic this visit was conducted virtually. This visit type was conducted due to national recommendations for restrictions regarding the COVID-19 Pandemic (e.g. social distancing, sheltering in place) in an effort to limit this patient's exposure and mitigate transmission in our community. All issues noted in this document were discussed and addressed.  A physical exam was not performed with this format.  I connected with Stephanie Buckley on 05/27/20 at 9:20 by telephone and verified that I am speaking with the correct person using two identifiers. Stephanie Buckley is currently located at home and no one is currently with her during visit. The provider, Mary-Margaret Hassell Done, FNP is located in their office at time of visit.  I discussed the limitations, risks, security and privacy concerns of performing an evaluation and management service by telephone and the availability of in person appointments. I also discussed with the patient that there may be a patient responsible charge related to this service. The patient expressed understanding and agreed to proceed.   History and Present Illness:   Chief Complaint: Medical Management of Chronic Issues   HPI Patient comesin for follow up of ADHD. She has been on meds for many years. She is on adderall 20 mg bid. Works well for her. No sie effects.   * she was exposed to covid and is in quarantine at home with no symptoms right now.  Review of Systems  Constitutional: Negative for diaphoresis and weight loss.  Eyes: Negative for blurred vision, double vision and pain.  Respiratory: Negative for shortness of breath.   Cardiovascular: Negative for chest pain, palpitations, orthopnea and leg swelling.  Gastrointestinal: Negative for abdominal pain.  Skin: Negative for rash.  Neurological: Negative for dizziness, sensory change, loss of consciousness, weakness and headaches.  Endo/Heme/Allergies:  Negative for polydipsia. Does not bruise/bleed easily.  Psychiatric/Behavioral: Negative for memory loss. The patient does not have insomnia.   All other systems reviewed and are negative.    Observations/Objective: Alert and oriented- answers all questions appropriately No distress    Assessment and Plan: Stephanie Buckley in today with chief complaint of Medical Management of Chronic Issues   1. ADHD, adult residual type Continue stress management - amphetamine-dextroamphetamine (ADDERALL XR) 20 MG 24 hr capsule; Take 2 capsules (40 mg total) by mouth daily.  Dispense: 60 capsule; Refill: 0 - amphetamine-dextroamphetamine (ADDERALL XR) 20 MG 24 hr capsule; Take 2 capsules (40 mg total) by mouth daily.  Dispense: 60 capsule; Refill: 0 - amphetamine-dextroamphetamine (ADDERALL XR) 20 MG 24 hr capsule; Take 2 capsules (40 mg total) by mouth daily.  Dispense: 60 capsule; Refill: 0    Follow Up Instructions: prn    I discussed the assessment and treatment plan with the patient. The patient was provided an opportunity to ask questions and all were answered. The patient agreed with the plan and demonstrated an understanding of the instructions.   The patient was advised to call back or seek an in-person evaluation if the symptoms worsen or if the condition fails to improve as anticipated.  The above assessment and management plan was discussed with the patient. The patient verbalized understanding of and has agreed to the management plan. Patient is aware to call the clinic if symptoms persist or worsen. Patient is aware when to return to the clinic for a follow-up visit. Patient educated on when it is appropriate to go to the emergency department.   Time call ended:  9:36  I  provided 16 minutes of non-face-to-face time during this encounter.    Mary-Margaret Hassell Done, FNP

## 2020-07-24 ENCOUNTER — Other Ambulatory Visit (HOSPITAL_COMMUNITY): Payer: Self-pay | Admitting: Nurse Practitioner

## 2020-07-24 DIAGNOSIS — Z1231 Encounter for screening mammogram for malignant neoplasm of breast: Secondary | ICD-10-CM

## 2020-07-30 ENCOUNTER — Inpatient Hospital Stay (HOSPITAL_COMMUNITY): Admission: RE | Admit: 2020-07-30 | Payer: BC Managed Care – PPO | Source: Ambulatory Visit

## 2020-07-30 DIAGNOSIS — Z1231 Encounter for screening mammogram for malignant neoplasm of breast: Secondary | ICD-10-CM

## 2020-08-25 ENCOUNTER — Encounter: Payer: Self-pay | Admitting: Nurse Practitioner

## 2020-08-25 ENCOUNTER — Ambulatory Visit: Payer: BC Managed Care – PPO | Admitting: Nurse Practitioner

## 2020-08-25 ENCOUNTER — Other Ambulatory Visit: Payer: Self-pay

## 2020-08-25 VITALS — BP 109/77 | HR 88 | Temp 97.6°F | Resp 20 | Ht 67.0 in | Wt 218.0 lb

## 2020-08-25 DIAGNOSIS — F908 Attention-deficit hyperactivity disorder, other type: Secondary | ICD-10-CM

## 2020-08-25 MED ORDER — AMPHETAMINE-DEXTROAMPHET ER 20 MG PO CP24: 40.0000 mg | ORAL_CAPSULE | Freq: Every day | ORAL | 0 refills | Status: DC

## 2020-08-25 NOTE — Progress Notes (Signed)
   Subjective:    Patient ID: Stephanie Buckley, female    DOB: Feb 25, 1976, 45 y.o.   MRN: [id number]   Chief Complaint: Recheck ADHD    HPI: Patient come sin today for adhd follow up.she is on adderall XR 20mg  daily. She is doing well on medication. Needs it to concentrate at work. She deneis any side effects.   Outpatient Encounter Medications as of 08/25/2020  Medication Sig  . amphetamine-dextroamphetamine (ADDERALL XR) 20 MG 24 hr capsule Take 2 capsules (40 mg total) by mouth daily.  10/23/2020 amphetamine-dextroamphetamine (ADDERALL XR) 20 MG 24 hr capsule Take 2 capsules (40 mg total) by mouth daily.  Marland Kitchen amphetamine-dextroamphetamine (ADDERALL XR) 20 MG 24 hr capsule Take 2 capsules (40 mg total) by mouth daily.   No facility-administered encounter medications on file as of 08/25/2020.    History reviewed. No pertinent surgical history.  Family History  Problem Relation Age of Onset  . 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  Eyes: Negative for pain.  Respiratory: Negative for shortness of breath.   Cardiovascular: Negative for chest pain, palpitations and leg swelling.  Gastrointestinal: Negative for abdominal pain.  Endocrine: Negative for polydipsia.  Skin: Negative for rash.  Neurological: Negative for dizziness, weakness and headaches.  Hematological: Does not bruise/bleed easily.  All other systems reviewed and are negative.      Objective:   Physical Exam Nursing note reviewed.  Constitutional:      Appearance: Normal appearance.  Cardiovascular:     Rate and Rhythm: Regular rhythm.     Heart sounds: Normal heart sounds.  Pulmonary:     Breath sounds: Normal breath sounds.  Skin:    General: Skin is warm.  Neurological:     General: No focal deficit present.     Mental Status: She is alert and  oriented to person, place, and time.  Psychiatric:        Mood and Affect: Mood normal.    BP 109/77   Pulse 88   Temp 97.6 F (36.4 C) (Temporal)   Resp 20   Ht 5\' 7"  (1.702 m)   Wt 218 lb (98.9 kg)   SpO2 98%   BMI 34.14 kg/m         Assessment & Plan:  03/06/20 in today with chief complaint of Recheck ADHD   1. ADHD, adult residual type Continue stress management - amphetamine-dextroamphetamine (ADDERALL XR) 20 MG 24 hr capsule; Take 2 capsules (40 mg total) by mouth daily.  Dispense: 60 capsule; Refill: 0 - amphetamine-dextroamphetamine (ADDERALL XR) 20 MG 24 hr capsule; Take 2 capsules (40 mg total) by mouth daily.  Dispense: 60 capsule; Refill: 0 - amphetamine-dextroamphetamine (ADDERALL XR) 20 MG 24 hr capsule; Take 2 capsules (40 mg total) by mouth daily.  Dispense: 60 capsule; Refill: 0    The above assessment and management plan was discussed with the patient. The patient verbalized understanding of and has agreed to the management plan. Patient is aware to call the clinic if symptoms persist or worsen. Patient is aware when to return to the clinic for a follow-up visit. Patient educated on when it is appropriate to go to the emergency department.   Mary-Margaret , FNP

## 2020-11-18 ENCOUNTER — Ambulatory Visit: Payer: BC Managed Care – PPO | Admitting: Nurse Practitioner

## 2020-11-18 ENCOUNTER — Other Ambulatory Visit: Payer: Self-pay

## 2020-11-18 ENCOUNTER — Encounter: Payer: Self-pay | Admitting: Nurse Practitioner

## 2020-11-18 VITALS — BP 111/80 | HR 78 | Temp 97.5°F | Resp 20 | Ht 67.0 in | Wt 219.0 lb

## 2020-11-18 DIAGNOSIS — F908 Attention-deficit hyperactivity disorder, other type: Secondary | ICD-10-CM | POA: Diagnosis not present

## 2020-11-18 MED ORDER — AMPHETAMINE-DEXTROAMPHET ER 20 MG PO CP24: 40.0000 mg | ORAL_CAPSULE | Freq: Every day | ORAL | 0 refills | Status: DC

## 2020-11-18 NOTE — Progress Notes (Signed)
   Subjective:    Patient ID: Stephanie Buckley, female    DOB: 1976/08/11, 46 y.o.   MRN: [id number]   Chief Complaint: ADHD   HPI Patient is currently on adderall XR 20mg  dally. She has been on meds for many years. She is not able to concentrate at work without medication. She denies any medication side effects.     Review of Systems  Constitutional: Negative for diaphoresis.  Eyes: Negative for pain.  Respiratory: Negative for shortness of breath.   Cardiovascular: Negative for chest pain, palpitations and leg swelling.  Gastrointestinal: Negative for abdominal pain.  Endocrine: Negative for polydipsia.  Skin: Negative for rash.  Neurological: Negative for dizziness, weakness and headaches.  Hematological: Does not bruise/bleed easily.  All other systems reviewed and are negative.      Objective:   Physical Exam Vitals and nursing note reviewed.  Constitutional:      Appearance: Normal appearance.  Cardiovascular:     Rate and Rhythm: Normal rate and regular rhythm.     Heart sounds: Normal heart sounds.  Pulmonary:     Effort: Pulmonary effort is normal.     Breath sounds: Normal breath sounds.  Skin:    General: Skin is warm.  Neurological:     General: No focal deficit present.     Mental Status: She is alert and oriented to person, place, and time.  Psychiatric:        Mood and Affect: Mood normal.        Behavior: Behavior normal.     BP 111/80   Pulse 78   Temp (!) 97.5 F (36.4 C) (Temporal)   Resp 20   Ht 5\' 7"  (1.702 m)   Wt 219 lb (99.3 kg)   SpO2 99%   BMI 34.30 kg/m      Assessment & Plan:   [name] in today with chief complaint of ADHD   1. ADHD, adult residual type Stress management - amphetamine-dextroamphetamine (ADDERALL XR) 20 MG 24 hr capsule; Take 2 capsules (40 mg total) by mouth daily.  Dispense: 60 capsule; Refill: 0 - amphetamine-dextroamphetamine (ADDERALL XR) 20 MG 24 hr capsule; Take 2 capsules (40 mg total) by  mouth daily.  Dispense: 60 capsule; Refill: 0 - amphetamine-dextroamphetamine (ADDERALL XR) 20 MG 24 hr capsule; Take 2 capsules (40 mg total) by mouth daily.  Dispense: 60 capsule; Refill: 0    The above assessment and management plan was discussed with the patient. The patient verbalized understanding of and has agreed to the management plan. Patient is aware to call the clinic if symptoms persist or worsen. Patient is aware when to return to the clinic for a follow-up visit. Patient educated on when it is appropriate to go to the emergency department.   Mary-Margaret Hassell Done, FNP

## 2021-01-25 ENCOUNTER — Other Ambulatory Visit: Payer: Self-pay | Admitting: Nurse Practitioner

## 2021-01-25 DIAGNOSIS — F908 Attention-deficit hyperactivity disorder, other type: Secondary | ICD-10-CM

## 2021-02-20 ENCOUNTER — Encounter: Payer: Self-pay | Admitting: Nurse Practitioner

## 2021-02-20 ENCOUNTER — Ambulatory Visit (INDEPENDENT_AMBULATORY_CARE_PROVIDER_SITE_OTHER): Payer: BC Managed Care – PPO | Admitting: Nurse Practitioner

## 2021-02-20 ENCOUNTER — Other Ambulatory Visit: Payer: Self-pay

## 2021-02-20 VITALS — BP 107/76 | HR 93 | Temp 97.8°F | Resp 20 | Ht 67.0 in | Wt 220.0 lb

## 2021-02-20 DIAGNOSIS — F908 Attention-deficit hyperactivity disorder, other type: Secondary | ICD-10-CM

## 2021-02-20 MED ORDER — AMPHETAMINE-DEXTROAMPHET ER 20 MG PO CP24: 40.0000 mg | ORAL_CAPSULE | Freq: Every day | ORAL | 0 refills | Status: DC

## 2021-02-20 MED ORDER — AMPHETAMINE-DEXTROAMPHET ER 20 MG PO CP24
40.0000 mg | ORAL_CAPSULE | Freq: Every day | ORAL | 0 refills | Status: DC
Start: 2021-03-23 — End: 2021-05-21

## 2021-02-20 NOTE — Progress Notes (Signed)
   Subjective:    Patient ID: Stephanie Buckley, female    DOB: 1976-03-14, 45 y.o.   MRN: [id number]   Chief Complaint: ADHD   HPI Patient comes in today for refill of adderall. She has been on adderall for many years. She is not able to concentrate at work without meds. She denies any medication side effects    Review of Systems  Constitutional:  Negative for diaphoresis.  Eyes:  Negative for pain.  Respiratory:  Negative for shortness of breath.   Cardiovascular:  Negative for chest pain, palpitations and leg swelling.  Gastrointestinal:  Negative for abdominal pain.  Endocrine: Negative for polydipsia.  Skin:  Negative for rash.  Neurological:  Negative for dizziness, weakness and headaches.  Hematological:  Does not bruise/bleed easily.  All other systems reviewed and are negative.     Objective:   Physical Exam Vitals and nursing note reviewed.  Constitutional:      Appearance: Normal appearance. She is obese.  Cardiovascular:     Rate and Rhythm: Normal rate and regular rhythm.     Heart sounds: Normal heart sounds.  Pulmonary:     Effort: Pulmonary effort is normal.     Breath sounds: Normal breath sounds.  Skin:    General: Skin is warm.  Neurological:     General: No focal deficit present.     Mental Status: She is alert and oriented to person, place, and time.  Psychiatric:        Mood and Affect: Mood normal.        Behavior: Behavior normal.    BP 107/76   Pulse 93   Temp 97.8 F (36.6 C) (Temporal)   Resp 20   Ht 5\' 7"  (1.702 m)   Wt 220 lb (99.8 kg)   SpO2 95%   BMI 34.46 kg/m        Assessment & Plan:   Stephanie Buckley in today with chief complaint of ADHD   1. ADHD, adult residual type Stress management - amphetamine-dextroamphetamine (ADDERALL XR) 20 MG 24 hr capsule; Take 2 capsules (40 mg total) by mouth daily.  Dispense: 60 capsule; Refill: 0 - amphetamine-dextroamphetamine (ADDERALL XR) 20 MG 24 hr capsule; Take 2 capsules (40 mg  total) by mouth daily.  Dispense: 60 capsule; Refill: 0 - amphetamine-dextroamphetamine (ADDERALL XR) 20 MG 24 hr capsule; Take 2 capsules (40 mg total) by mouth daily.  Dispense: 60 capsule; Refill: 0    The above assessment and management plan was discussed with the patient. The patient verbalized understanding of and has agreed to the management plan. Patient is aware to call the clinic if symptoms persist or worsen. Patient is aware when to return to the clinic for a follow-up visit. Patient educated on when it is appropriate to go to the emergency department.   Mary-Margaret Hassell Done, FNP

## 2021-04-21 ENCOUNTER — Ambulatory Visit: Payer: Self-pay | Admitting: Nurse Practitioner

## 2021-05-21 ENCOUNTER — Encounter: Payer: Self-pay | Admitting: Nurse Practitioner

## 2021-05-21 ENCOUNTER — Ambulatory Visit (INDEPENDENT_AMBULATORY_CARE_PROVIDER_SITE_OTHER): Payer: BC Managed Care – PPO | Admitting: Nurse Practitioner

## 2021-05-21 ENCOUNTER — Other Ambulatory Visit: Payer: Self-pay

## 2021-05-21 VITALS — BP 113/79 | HR 93 | Temp 98.2°F | Resp 20 | Ht 67.0 in | Wt 230.0 lb

## 2021-05-21 DIAGNOSIS — F908 Attention-deficit hyperactivity disorder, other type: Secondary | ICD-10-CM

## 2021-05-21 MED ORDER — AMPHETAMINE-DEXTROAMPHET ER 20 MG PO CP24: 40.0000 mg | ORAL_CAPSULE | Freq: Every day | ORAL | 0 refills | Status: DC

## 2021-05-21 NOTE — Patient Instructions (Signed)
Stress, Adult Stress is a normal reaction to life events. Stress is what you feel when life demands more than you are used to, or more than you think you can handle. Some stress can be useful, such as studying for a test or meeting a deadline at work. Stress that occurs too often or for too long can cause problems. It can affect your emotional health and interfere with relationships and normal daily activities. Too much stress can weaken your body's defense system (immune system) and increase your risk for physical illness. If you already have a medical problem, stress can make it worse. What are the causes? All sorts of life events can cause stress. An event that causes stress for one person may not be stressful for another person. Major life events, whether positive or negative, commonly cause stress. Examples include: Losing a job or starting a new job. Losing a loved one. Moving to a new town or home. Getting married or divorced. Having a baby. Getting injured or sick. Less obvious life events can also cause stress, especially if they occur day after day or in combination with each other. Examples include: Working long hours. Driving in traffic. Caring for children. Being in debt. Being in a difficult relationship. What are the signs or symptoms? Stress can cause emotional symptoms, including: Anxiety. This is feeling worried, afraid, on edge, overwhelmed, or out of control. Anger, including irritation or impatience. Depression. This is feeling sad, down, helpless, or guilty. Trouble focusing, remembering, or making decisions. Stress can cause physical symptoms, including: Aches and pains. These may affect your head, neck, back, stomach, or other areas of your body. Tight muscles or a clenched jaw. Low energy. Trouble sleeping. Stress can cause unhealthy behaviors, including: Eating to feel better (overeating) or skipping meals. Working too much or putting off tasks. Smoking,  drinking alcohol, or using drugs to feel better. How is this diagnosed? Stress is diagnosed through an assessment by your health care provider. He or she may diagnose this condition based on: Your symptoms and any stressful life events. Your medical history. Tests to rule out other causes of your symptoms. Depending on your condition, your health care provider may refer you to a specialist for further evaluation. How is this treated? Stress management techniques are the recommended treatment for stress. Medicine is not typically recommended for the treatment of stress. Techniques to reduce your reaction to stressful life events include: Stress identification. Monitor yourself for symptoms of stress and identify what causes stress for you. These skills may help you to avoid or prepare for stressful events. Time management. Set your priorities, keep a calendar of events, and learn to say no. Taking these actions can help you avoid making too many commitments. Techniques for coping with stress include: Rethinking the problem. Try to think realistically about stressful events rather than ignoring them or overreacting. Try to find the positives in a stressful situation rather than focusing on the negatives. Exercise. Physical exercise can release both physical and emotional tension. The key is to find a form of exercise that you enjoy and do it regularly. Relaxation techniques. These relax the body and mind. The key is to find one or more that you enjoy and use the techniques regularly. Examples include: Meditation, deep breathing, or progressive relaxation techniques. Yoga or tai chi. Biofeedback, mindfulness techniques, or journaling. Listening to music, being out in nature, or participating in other hobbies. Practicing a healthy lifestyle. Eat a balanced diet, drink plenty of water, limit or  avoid caffeine, and get plenty of sleep. Having a strong support network. Spend time with family, friends,  or other people you enjoy being around. Express your feelings and talk things over with someone you trust. Counseling or talk therapy with a mental health professional may be helpful if you are having trouble managing stress on your own. Follow these instructions at home: Lifestyle  Avoid drugs. Do not use any products that contain nicotine or tobacco, such as cigarettes, e-cigarettes, and chewing tobacco. If you need help quitting, ask your health care provider. Limit alcohol intake to no more than 1 drink a day for nonpregnant women and 2 drinks a day for men. One drink equals 12 oz of beer, 5 oz of wine, or 1 oz of hard liquor Do not use alcohol or drugs to relax. Eat a balanced diet that includes fresh fruits and vegetables, whole grains, lean meats, fish, eggs, and beans, and low-fat dairy. Avoid processed foods and foods high in added fat, sugar, and salt. Exercise at least 30 minutes on 5 or more days each week. Get 7-8 hours of sleep each night. General instructions  Practice stress management techniques as discussed with your health care provider. Drink enough fluid to keep your urine clear or pale yellow. Take over-the-counter and prescription medicines only as told by your health care provider. Keep all follow-up visits as told by your health care provider. This is important. Contact a health care provider if: Your symptoms get worse. You have new symptoms. You feel overwhelmed by your problems and can no longer manage them on your own. Get help right away if: You have thoughts of hurting yourself or others. If you ever feel like you may hurt yourself or others, or have thoughts about taking your own life, get help right away. You can go to your nearest emergency department or call: Your local emergency services (911 in the [place] A suicide crisis helpline, such as the [facility] at (410)025-6148. This is open 24 hours a day. Summary Stress is a  normal reaction to life events. It can cause problems if it happens too often or for too long. Practicing stress management techniques is the best way to treat stress. Counseling or talk therapy with a mental health professional may be helpful if you are having trouble managing stress on your own. This information is not intended to replace advice given to you by your health care provider. Make sure you discuss any questions you have with your health care provider. Document Revised: 10/17/2020 Document Reviewed: 04/25/2020 Elsevier Patient Education  2022 [employer].

## 2021-05-21 NOTE — Progress Notes (Signed)
   Subjective:    Patient ID: Stephanie Buckley, female    DOB: 02-25-1976, 45 y.o.   MRN: [id number]   Chief Complaint: ADHD follow up  HPI Patient comes in today for follow up of ADHD. She is currently on adderall 20mg . 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 She has no side effects from medication. She says she is doing well on current dose and denies any medication side effects.    Review of Systems  Constitutional:  Negative for diaphoresis.  Eyes:  Negative for pain.  Respiratory:  Negative for shortness of breath.   Cardiovascular:  Negative for chest pain, palpitations and leg swelling.  Gastrointestinal:  Negative for abdominal pain.  Endocrine: Negative for polydipsia.  Skin:  Negative for rash.  Neurological:  Negative for dizziness, weakness and headaches.  Hematological:  Does not bruise/bleed easily.  All other systems reviewed and are negative.     Objective:   Physical Exam Vitals and nursing note reviewed.  Constitutional:      Appearance: Normal appearance.  Cardiovascular:     Rate and Rhythm: Normal rate and regular rhythm.     Heart sounds: Normal heart sounds.  Pulmonary:     Effort: Pulmonary effort is normal.     Breath sounds: Normal breath sounds.  Skin:    General: Skin is warm.  Neurological:     General: No focal deficit present.     Mental Status: She is alert and oriented to person, place, and time.  Psychiatric:        Mood and Affect: Mood normal.        Behavior: Behavior normal.    BP 113/79   Pulse 93   Temp 98.2 F (36.8 C) (Temporal)   Resp 20   Ht 5\' 7"  (1.702 m)   Wt 230 lb (104.3 kg)   SpO2 97%   BMI 36.02 kg/m         Assessment & Plan:  Stephanie Buckley in today with chief complaint of ADHD   1. ADHD, adult residual type Stress management Needs annual physical - amphetamine-dextroamphetamine (ADDERALL XR) 20 MG 24 hr capsule; Take 2 capsules (40 mg total) by mouth daily.  Dispense: 60  capsule; Refill: 0 - amphetamine-dextroamphetamine (ADDERALL XR) 20 MG 24 hr capsule; Take 2 capsules (40 mg total) by mouth daily.  Dispense: 60 capsule; Refill: 0 - amphetamine-dextroamphetamine (ADDERALL XR) 20 MG 24 hr capsule; Take 2 capsules (40 mg total) by mouth daily.  Dispense: 60 capsule; Refill: 0    The above assessment and management plan was discussed with the patient. The patient verbalized understanding of and has agreed to the management plan. Patient is aware to call the clinic if symptoms persist or worsen. Patient is aware when to return to the clinic for a follow-up visit. Patient educated on when it is appropriate to go to the emergency department.   Mary-Margaret Hassell Done, FNP

## 2021-08-20 ENCOUNTER — Ambulatory Visit: Payer: BC Managed Care – PPO | Admitting: Nurse Practitioner

## 2021-08-20 ENCOUNTER — Encounter: Payer: Self-pay | Admitting: Nurse Practitioner

## 2021-08-20 VITALS — BP 109/79 | HR 120 | Temp 97.8°F | Ht 67.0 in | Wt 234.8 lb

## 2021-08-20 DIAGNOSIS — Z0000 Encounter for general adult medical examination without abnormal findings: Secondary | ICD-10-CM

## 2021-08-20 DIAGNOSIS — F908 Attention-deficit hyperactivity disorder, other type: Secondary | ICD-10-CM

## 2021-08-20 MED ORDER — AMPHETAMINE-DEXTROAMPHET ER 20 MG PO CP24: 40.0000 mg | ORAL_CAPSULE | Freq: Every day | ORAL | 0 refills | Status: DC

## 2021-08-20 NOTE — Progress Notes (Signed)
Subjective:    Patient ID: Stephanie Buckley, female    DOB: 05-25-1976, 45 y.o.   MRN: [id number]  Chief Complaint: ADHD   HPI Patient comes in today for follow up of ADHD. She is currently on adderall XR 20mg  daily  And is doing well. Without meds she is not able to concentrate at work without meds. She denies any medication side effects.   Review of Systems  Constitutional:  Negative for diaphoresis.  Eyes:  Negative for pain.  Respiratory:  Negative for shortness of breath.   Cardiovascular:  Negative for chest pain, palpitations and leg swelling.  Gastrointestinal:  Negative for abdominal pain.  Endocrine: Negative for polydipsia.  Skin:  Negative for rash.  Neurological:  Negative for dizziness, weakness and headaches.  Hematological:  Does not bruise/bleed easily.  All other systems reviewed and are negative.     Objective:   Physical Exam Vitals and nursing note reviewed.  Constitutional:      General: She is not in acute distress.    Appearance: Normal appearance. She is well-developed.  Neck:     Vascular: No carotid bruit or JVD.  Cardiovascular:     Rate and Rhythm: Normal rate and regular rhythm.     Heart sounds: Normal heart sounds.  Pulmonary:     Effort: Pulmonary effort is normal. No respiratory distress.     Breath sounds: Normal breath sounds. No wheezing or rales.  Chest:     Chest wall: No tenderness.  Abdominal:     General: Bowel sounds are normal. There is no distension or abdominal bruit.     Palpations: Abdomen is soft. There is no hepatomegaly, splenomegaly, mass or pulsatile mass.     Tenderness: There is no abdominal tenderness.  Musculoskeletal:        General: Normal range of motion.     Cervical back: Normal range of motion and neck supple.  Lymphadenopathy:     Cervical: No cervical adenopathy.  Skin:    General: Skin is warm and dry.  Neurological:     Mental Status: She is alert and oriented to person, place, and time.     Deep  Tendon Reflexes: Reflexes are normal and symmetric.  Psychiatric:        Behavior: Behavior normal.        Thought Content: Thought content normal.        Judgment: Judgment normal.    BP 109/79    Pulse (!) 120    Temp 97.8 F (36.6 C) (Temporal)    Ht 5\' 7"  (1.702 m)    Wt 234 lb 12.8 oz (106.5 kg)    SpO2 95%    BMI 36.77 kg/m        Assessment & Plan:   [name] in today with chief complaint of ADHD   1. ADHD, adult residual type Stress management - amphetamine-dextroamphetamine (ADDERALL XR) 20 MG 24 hr capsule; Take 2 capsules (40 mg total) by mouth daily.  Dispense: 60 capsule; Refill: 0 - amphetamine-dextroamphetamine (ADDERALL XR) 20 MG 24 hr capsule; Take 2 capsules (40 mg total) by mouth daily.  Dispense: 60 capsule; Refill: 0 - amphetamine-dextroamphetamine (ADDERALL XR) 20 MG 24 hr capsule; Take 2 capsules (40 mg total) by mouth daily.  Dispense: 60 capsule; Refill: 0    The above assessment and management plan was discussed with the patient. The patient verbalized understanding of and has agreed to the management plan. Patient is aware to call the clinic  if symptoms persist or worsen. Patient is aware when to return to the clinic for a follow-up visit. Patient educated on when it is appropriate to go to the emergency department.   Mary-Margaret Hassell Done, FNP

## 2021-11-09 ENCOUNTER — Encounter: Payer: Self-pay | Admitting: Nurse Practitioner

## 2021-11-09 ENCOUNTER — Other Ambulatory Visit (HOSPITAL_COMMUNITY)
Admission: RE | Admit: 2021-11-09 | Discharge: 2021-11-09 | Disposition: A | Discharge status: Home / Self Care | Payer: BC Managed Care – PPO | Source: Ambulatory Visit | Attending: Nurse Practitioner | Admitting: Nurse Practitioner

## 2021-11-09 ENCOUNTER — Ambulatory Visit (INDEPENDENT_AMBULATORY_CARE_PROVIDER_SITE_OTHER): Payer: BC Managed Care – PPO | Admitting: Nurse Practitioner

## 2021-11-09 VITALS — BP 113/87 | HR 98 | Temp 97.8°F | Resp 20 | Ht 67.0 in | Wt 242.0 lb

## 2021-11-09 DIAGNOSIS — Z0000 Encounter for general adult medical examination without abnormal findings: Secondary | ICD-10-CM | POA: Insufficient documentation

## 2021-11-09 DIAGNOSIS — Z0001 Encounter for general adult medical examination with abnormal findings: Secondary | ICD-10-CM | POA: Diagnosis not present

## 2021-11-09 DIAGNOSIS — Z6837 Body mass index (BMI) 37.0-37.9, adult: Secondary | ICD-10-CM

## 2021-11-09 DIAGNOSIS — F908 Attention-deficit hyperactivity disorder, other type: Secondary | ICD-10-CM | POA: Diagnosis not present

## 2021-11-09 MED ORDER — AMPHETAMINE-DEXTROAMPHET ER 20 MG PO CP24: 40.0000 mg | ORAL_CAPSULE | Freq: Every day | ORAL | 0 refills | Status: DC

## 2021-11-09 NOTE — Progress Notes (Addendum)
Subjective:    Patient ID: Stephanie Buckley, female    DOB: [birth year], 46 y.o.   MRN: [id number]   Chief Complaint: annual physical with pap   HPI:  Stephanie Buckley is a 10 y.o. who identifies as a female who was assigned female at birth.   Social history: Lives with: husband Work history: Education officer, museum   Comes in today for follow up of the following chronic medical issues:  1. Annual physical exam Needs pap today  2. ADHD, adult residual type Is on adderall 43m 2 daily. Has been on adhdmeds for many years. Is not able to concentrate at work without medication. She has no side effects.   New complaints: Patient has been trying to loose weight for greater then a year. She will lose then gain. This has been on going since the birth of her last child. She has tried fad diets such as low carb, low fat as well as weight watchers. She desperately needs help. Wt Readings from Last 3 Encounters:  11/09/21 242 lb (109.8 kg)  08/20/21 234 lb 12.8 oz (106.5 kg)     Allergies  Allergen Reactions   Keflex [Cephalexin] Rash   Outpatient Encounter Medications as of 11/09/2021  Medication Sig   amphetamine-dextroamphetamine (ADDERALL XR) 20 MG 24 hr capsule Take 2 capsules (40 mg total) by mouth daily.   amphetamine-dextroamphetamine (ADDERALL XR) 20 MG 24 hr capsule Take 2 capsules (40 mg total) by mouth daily.   amphetamine-dextroamphetamine (ADDERALL XR) 20 MG 24 hr capsule Take 2 capsules (40 mg total) by mouth daily.   No facility-administered encounter medications on file as of 11/09/2021.    No past surgical history on file.  Family History  Problem Relation Age of Onset   Bipolar disorder Father       Controlled substance contract: 11/09/21     Review of Systems  Constitutional:  Negative for diaphoresis.  Eyes:  Negative for pain.  Respiratory:  Negative for shortness of breath.   Cardiovascular:  Negative for chest pain, palpitations and leg swelling.   Gastrointestinal:  Negative for abdominal pain.  Endocrine: Negative for polydipsia.  Skin:  Negative for rash.  Neurological:  Negative for dizziness, weakness and headaches.  Hematological:  Does not bruise/bleed easily.  All other systems reviewed and are negative.     Objective:   Physical Exam Vitals and nursing note reviewed.  Constitutional:      General: She is not in acute distress.    Appearance: Normal appearance. She is well-developed.  HENT:     Head: Normocephalic.     Right Ear: Tympanic membrane normal.     Left Ear: Tympanic membrane normal.     Nose: Nose normal.     Mouth/Throat:     Mouth: Mucous membranes are moist.  Eyes:     Pupils: Pupils are equal, round, and reactive to light.  Neck:     Vascular: No carotid bruit or JVD.  Cardiovascular:     Rate and Rhythm: Normal rate and regular rhythm.     Heart sounds: Normal heart sounds.  Pulmonary:     Effort: Pulmonary effort is normal. No respiratory distress.     Breath sounds: Normal breath sounds. No wheezing or rales.  Chest:     Chest wall: No tenderness.  Abdominal:     General: Bowel sounds are normal. There is no distension or abdominal bruit.     Palpations: Abdomen is soft. There is no hepatomegaly, splenomegaly,  mass or pulsatile mass.     Tenderness: There is no abdominal tenderness.  Genitourinary:    General: Normal vulva.     Vagina: Vaginal discharge present.     Rectum: Normal.     Comments: Cervix parous and pink No adnexal masses or tenderness Musculoskeletal:        General: Normal range of motion.     Cervical back: Normal range of motion and neck supple.  Lymphadenopathy:     Cervical: No cervical adenopathy.  Skin:    General: Skin is warm and dry.  Neurological:     Mental Status: She is alert and oriented to person, place, and time.     Deep Tendon Reflexes: Reflexes are normal and symmetric.  Psychiatric:        Behavior: Behavior normal.        Thought Content:  Thought content normal.        Judgment: Judgment normal.    BP 113/87   Pulse 98   Temp 97.8 F (36.6 C) (Temporal)   Resp 20   Ht 5' 7"  (1.702 m)   Wt 242 lb (109.8 kg)   SpO2 100%   BMI 37.90 kg/m        Assessment & Plan:  Stephanie Buckley comes in today with chief complaint of Annual Exam   Diagnosis and orders addressed:  1. Annual physical exam Labs oending - Urinalysis, Complete - CBC with Differential/Platelet - CMP14+EGFR - Lipid panel - Thyroid Panel With TSH - Cytology - PAP  2. ADHD, adult residual type Stress management - amphetamine-dextroamphetamine (ADDERALL XR) 20 MG 24 hr capsule; Take 2 capsules (40 mg total) by mouth daily.  Dispense: 60 capsule; Refill: 0 - amphetamine-dextroamphetamine (ADDERALL XR) 20 MG 24 hr capsule; Take 2 capsules (40 mg total) by mouth daily.  Dispense: 60 capsule; Refill: 0 - amphetamine-dextroamphetamine (ADDERALL XR) 20 MG 24 hr capsule; Take 2 capsules (40 mg total) by mouth daily.  Dispense: 60 capsule; Refill: 0 - ToxASSURE Select 13 (MW), Urine  3. BMI 37.0-37.9 Will try to get wegovy or monujario  Health Maintenance reviewed Diet and exercise encouraged  Follow up plan: 3 months   Mary-Margaret Hassell Done, FNP

## 2021-11-09 NOTE — Patient Instructions (Signed)
Exercising to Stay Healthy °To become healthy and stay healthy, it is recommended that you do moderate-intensity and vigorous-intensity exercise. You can tell that you are exercising at a moderate intensity if your heart starts beating faster and you start breathing faster but can still hold a conversation. You can tell that you are exercising at a vigorous intensity if you are breathing much harder and faster and cannot hold a conversation while exercising. °How can exercise benefit me? °Exercising regularly is important. It has many health benefits, such as: °Improving overall fitness, flexibility, and endurance. °Increasing bone density. °Helping with weight control. °Decreasing body fat. °Increasing muscle strength and endurance. °Reducing stress and tension, anxiety, depression, or anger. °Improving overall health. °What guidelines should I follow while exercising? °Before you start a new exercise program, talk with your health care provider. °Do not exercise so much that you hurt yourself, feel dizzy, or get very short of breath. °Wear comfortable clothes and wear shoes with good support. °Drink plenty of water while you exercise to prevent dehydration or heat stroke. °Work out until your breathing and your heartbeat get faster (moderate intensity). °How often should I exercise? °Choose an activity that you enjoy, and set realistic goals. Your health care provider can help you make an activity plan that is individually designed and works best for you. °Exercise regularly as told by your health care provider. This may include: °Doing strength training two times a week, such as: °Lifting weights. °Using resistance bands. °Push-ups. °Sit-ups. °Yoga. °Doing a certain intensity of exercise for a given amount of time. Choose from these options: °A total of 150 minutes of moderate-intensity exercise every week. °A total of 75 minutes of vigorous-intensity exercise every week. °A mix of moderate-intensity and  vigorous-intensity exercise every week. °Children, pregnant women, people who have not exercised regularly, people who are overweight, and older adults may need to talk with a health care provider about what activities are safe to perform. If you have a medical condition, be sure to talk with your health care provider before you start a new exercise program. °What are some exercise ideas? °Moderate-intensity exercise ideas include: °Walking 1 mile (1.6 km) in about 15 minutes. °Biking. °Hiking. °Golfing. °Dancing. °Water aerobics. °Vigorous-intensity exercise ideas include: °Walking 4.5 miles (7.2 km) or more in about 1 hour. °Jogging or running 5 miles (8 km) in about 1 hour. °Biking 10 miles (16.1 km) or more in about 1 hour. °Lap swimming. °Roller-skating or in-line skating. °Cross-country skiing. °Vigorous competitive sports, such as football, basketball, and soccer. °Jumping rope. °Aerobic dancing. °What are some everyday activities that can help me get exercise? °Yard work, such as: °Pushing a lawn mower. °Raking and bagging leaves. °Washing your car. °Pushing a stroller. °Shoveling snow. °Gardening. °Washing windows or floors. °How can I be more active in my day-to-day activities? °Use stairs instead of an elevator. °Take a walk during your lunch break. °If you drive, park your car farther away from your work or school. °If you take public transportation, get off one stop early and walk the rest of the way. °Stand up or walk around during all of your indoor phone calls. °Get up, stretch, and walk around every 30 minutes throughout the day. °Enjoy exercise with a friend. Support to continue exercising will help you keep a regular routine of activity. °Where to find more information °You can find more information about exercising to stay healthy from: °U.S. Department of Health and Human Services: www.hhs.gov °Centers for Disease Control and Prevention (  CDC): www.cdc.gov °Summary °Exercising regularly is  important. It will improve your overall fitness, flexibility, and endurance. °Regular exercise will also improve your overall health. It can help you control your weight, reduce stress, and improve your bone density. °Do not exercise so much that you hurt yourself, feel dizzy, or get very short of breath. °Before you start a new exercise program, talk with your health care provider. °This information is not intended to replace advice given to you by your health care provider. Make sure you discuss any questions you have with your health care provider. °Document Revised: 12/05/2020 Document Reviewed: 12/05/2020 °Elsevier Patient Education © 2022 Elsevier Inc. ° °

## 2021-11-10 LAB — THYROID PANEL WITH TSH
Free Thyroxine Index: 1.6 (ref 1.2–4.9)
T3 Uptake Ratio: 22 % — ABNORMAL LOW (ref 24–39)
T4, Total: 7.3 ug/dL (ref 4.5–12.0)
TSH: 1.58 u[IU]/mL (ref 0.450–4.500)

## 2021-11-10 LAB — CBC WITH DIFFERENTIAL/PLATELET
Basophils Absolute: 0.1 10*3/uL (ref 0.0–0.2)
Basos: 1 %
EOS (ABSOLUTE): 0.1 10*3/uL (ref 0.0–0.4)
Eos: 1 %
Hematocrit: 41.5 % (ref 34.0–46.6)
Hemoglobin: 13.9 g/dL (ref 11.1–15.9)
Immature Grans (Abs): 0.1 10*3/uL (ref 0.0–0.1)
Immature Granulocytes: 1 %
Lymphocytes Absolute: 2.5 10*3/uL (ref 0.7–3.1)
Lymphs: 21 %
MCH: 28.9 pg (ref 26.6–33.0)
MCHC: 33.5 g/dL (ref 31.5–35.7)
MCV: 86 fL (ref 79–97)
Monocytes Absolute: 1 10*3/uL — ABNORMAL HIGH (ref 0.1–0.9)
Monocytes: 8 %
Neutrophils Absolute: 8.6 10*3/uL — ABNORMAL HIGH (ref 1.4–7.0)
Neutrophils: 68 %
Platelets: 386 10*3/uL (ref 150–450)
RBC: 4.81 x10E6/uL (ref 3.77–5.28)
RDW: 12.7 % (ref 11.7–15.4)
WBC: 12.3 10*3/uL — ABNORMAL HIGH (ref 3.4–10.8)

## 2021-11-10 LAB — URINALYSIS, COMPLETE
Bilirubin, UA: NEGATIVE
Glucose, UA: NEGATIVE
Ketones, UA: NEGATIVE
Leukocytes,UA: NEGATIVE
Nitrite, UA: NEGATIVE
Protein,UA: NEGATIVE
RBC, UA: NEGATIVE
Specific Gravity, UA: 1.02 (ref 1.005–1.030)
Urobilinogen, Ur: 0.2 mg/dL (ref 0.2–1.0)
pH, UA: 7 (ref 5.0–7.5)

## 2021-11-10 LAB — CMP14+EGFR
ALT: 19 IU/L (ref 0–32)
AST: 16 IU/L (ref 0–40)
Albumin/Globulin Ratio: 1.5 (ref 1.2–2.2)
Albumin: 4.1 g/dL (ref 3.8–4.8)
Alkaline Phosphatase: 95 IU/L (ref 44–121)
BUN/Creatinine Ratio: 14 (ref 9–23)
BUN: 13 mg/dL (ref 6–24)
Bilirubin Total: <0.2 mg/dL (ref 0.0–1.2)
CO2: 26 mmol/L (ref 20–29)
Calcium: 9.5 mg/dL (ref 8.7–10.2)
Chloride: 101 mmol/L (ref 96–106)
Creatinine, Ser: 0.94 mg/dL (ref 0.57–1.00)
Globulin, Total: 2.7 g/dL (ref 1.5–4.5)
Glucose: 92 mg/dL (ref 70–99)
Potassium: 4.4 mmol/L (ref 3.5–5.2)
Sodium: 140 mmol/L (ref 134–144)
Total Protein: 6.8 g/dL (ref 6.0–8.5)
eGFR: 77 mL/min/{1.73_m2} (ref >59)

## 2021-11-10 LAB — LIPID PANEL
Chol/HDL Ratio: 3.2 ratio (ref 0.0–4.4)
Cholesterol, Total: 164 mg/dL (ref 100–199)
HDL: 52 mg/dL (ref >39)
LDL Chol Calc (NIH): 86 mg/dL (ref 0–99)
Triglycerides: 150 mg/dL — ABNORMAL HIGH (ref 0–149)
VLDL Cholesterol Cal: 26 mg/dL (ref 5–40)

## 2021-11-11 LAB — CYTOLOGY - PAP: Diagnosis: NEGATIVE

## 2021-11-13 LAB — TOXASSURE SELECT 13 (MW), URINE

## 2021-12-22 ENCOUNTER — Ambulatory Visit (INDEPENDENT_AMBULATORY_CARE_PROVIDER_SITE_OTHER): Payer: BC Managed Care – PPO

## 2021-12-22 ENCOUNTER — Ambulatory Visit: Payer: BC Managed Care – PPO | Admitting: Nurse Practitioner

## 2021-12-22 ENCOUNTER — Encounter: Payer: Self-pay | Admitting: Nurse Practitioner

## 2021-12-22 VITALS — BP 104/73 | HR 94 | Temp 97.6°F | Resp 20 | Ht 67.0 in | Wt 235.0 lb

## 2021-12-22 DIAGNOSIS — F908 Attention-deficit hyperactivity disorder, other type: Secondary | ICD-10-CM | POA: Diagnosis not present

## 2021-12-22 DIAGNOSIS — M79671 Pain in right foot: Secondary | ICD-10-CM | POA: Diagnosis not present

## 2021-12-22 MED ORDER — AMPHETAMINE-DEXTROAMPHET ER 20 MG PO CP24: 40.0000 mg | ORAL_CAPSULE | Freq: Every day | ORAL | 0 refills | Status: DC

## 2021-12-22 MED ORDER — CELECOXIB 200 MG PO CAPS: 200.0000 mg | ORAL_CAPSULE | Freq: Two times a day (BID) | ORAL | 2 refills | Status: DC

## 2021-12-22 MED ORDER — WEGOVY 0.25 MG/0.5ML ~~LOC~~ SOAJ: 0.2500 mg | SUBCUTANEOUS | 0 refills | Status: DC

## 2021-12-22 NOTE — Patient Instructions (Signed)
Semaglutide Injection (Weight Management) ?What is this medication? ?SEMAGLUTIDE (SEM a GLOO tide) promotes weight loss. It may also be used to maintain weight loss. It works by decreasing appetite. Changes to diet and exercise are often combined with this medication. ?This medicine may be used for other purposes; ask your health care provider or pharmacist if you have questions. ?COMMON BRAND NAME(S): Wegovy ?What should I tell my care team before I take this medication? ?They need to know if you have any of these conditions: ?Endocrine tumors (MEN 2) or if someone in your family had these tumors ?Eye disease, vision problems ?Gallbladder disease ?History of depression or mental health disease ?History of pancreatitis ?Kidney disease ?Stomach or intestine problems ?Suicidal thoughts, plans, or attempt; a previous suicide attempt by you or a family member ?Thyroid cancer or if someone in your family had thyroid cancer ?An unusual or allergic reaction to semaglutide, other medications, foods, dyes, or preservatives ?Pregnant or trying to get pregnant ?Breast-feeding ?How should I use this medication? ?This medication is injected under the skin. You will be taught how to prepare and give it. Take it as directed on the prescription label. It is given once every week (every 7 days). Keep taking it unless your care team tells you to stop. ?It is important that you put your used needles and pens in a special sharps container. Do not put them in a trash can. If you do not have a sharps container, call your pharmacist or care team to get one. ?A special MedGuide will be given to you by the pharmacist with each prescription and refill. Be sure to read this information carefully each time. ?This medication comes with INSTRUCTIONS FOR USE. Ask your pharmacist for directions on how to use this medication. Read the information carefully. Talk to your pharmacist or care team if you have questions. ?Talk to your care team about  the use of this medication in children. While it may be prescribed for children as young as 12 years for selected conditions, precautions do apply. ?Overdosage: If you think you have taken too much of this medicine contact a poison control center or emergency room at once. ?NOTE: This medicine is only for you. Do not share this medicine with others. ?What if I miss a dose? ?If you miss a dose and the next scheduled dose is more than 2 days away, take the missed dose as soon as possible. If you miss a dose and the next scheduled dose is less than 2 days away, do not take the missed dose. Take the next dose at your regular time. Do not take double or extra doses. If you miss your dose for 2 weeks or more, take the next dose at your regular time or call your care team to talk about how to restart this medication. ?What may interact with this medication? ?Insulin and other medications for diabetes ?This list may not describe all possible interactions. Give your health care provider a list of all the medicines, herbs, non-prescription drugs, or dietary supplements you use. Also tell them if you smoke, drink alcohol, or use illegal drugs. Some items may interact with your medicine. ?What should I watch for while using this medication? ?Visit your care team for regular checks on your progress. It may be some time before you see the benefit from this medication. ?Drink plenty of fluids while taking this medication. Check with your care team if you have severe diarrhea, nausea, and vomiting, or if you sweat a   lot. The loss of too much body fluid may make it dangerous for you to take this medication. ?This medication may affect blood sugar levels. Ask your care team if changes in diet or medications are needed if you have diabetes. ?If you or your family notice any changes in your behavior, such as new or worsening depression, thoughts of harming yourself, anxiety, other unusual or disturbing thoughts, or memory loss, call  your care team right away. ?Women should inform their care team if they wish to become pregnant or think they might be pregnant. Losing weight while pregnant is not advised and may cause harm to the unborn child. Talk to your care team for more information. ?What side effects may I notice from receiving this medication? ?Side effects that you should report to your care team as soon as possible: ?Allergic reactions--skin rash, itching, hives, swelling of the face, lips, tongue, or throat ?Change in vision ?Dehydration--increased thirst, dry mouth, feeling faint or lightheaded, headache, dark yellow or brown urine ?Gallbladder problems--severe stomach pain, nausea, vomiting, fever ?Heart palpitations--rapid, pounding, or irregular heartbeat ?Kidney injury--decrease in the amount of urine, swelling of the ankles, hands, or feet ?Pancreatitis--severe stomach pain that spreads to your back or gets worse after eating or when touched, fever, nausea, vomiting ?Thoughts of suicide or self-harm, worsening mood, feelings of depression ?Thyroid cancer--new mass or lump in the neck, pain or trouble swallowing, trouble breathing, hoarseness ?Side effects that usually do not require medical attention (report to your care team if they continue or are bothersome): ?Diarrhea ?Loss of appetite ?Nausea ?Stomach pain ?Vomiting ?This list may not describe all possible side effects. Call your doctor for medical advice about side effects. You may report side effects to FDA at 1-800-FDA-1088. ?Where should I keep my medication? ?Keep out of the reach of children and pets. ?Refrigeration (preferred): Store in the refrigerator. Do not freeze. Keep this medication in the original container until you are ready to take it. Get rid of any unused medication after the expiration date. ?Room temperature: If needed, prior to cap removal, the pen can be stored at room temperature for up to 28 days. Protect from light. If it is stored at room  temperature, get rid of any unused medication after 28 days or after it expires, whichever is first. ?It is important to get rid of the medication as soon as you no longer need it or it is expired. You can do this in two ways: ?Take the medication to a medication take-back program. Check with your pharmacy or law enforcement to find a location. ?If you cannot return the medication, follow the directions in the MedGuide. ?NOTE: This sheet is a summary. It may not cover all possible information. If you have questions about this medicine, talk to your doctor, pharmacist, or health care provider. ?? 2023 Elsevier/Gold Standard (2021-08-26 00:00:00) ? ?

## 2021-12-22 NOTE — Progress Notes (Signed)
? ?Subjective:  ? ? Patient ID: Stephanie Buckley, female    DOB: 03/06/1976, 46 y.o.   MRN: [id number] ? ? ?Chief Complaint: foot pain ? ?HPI ?Patient comes in today with several complaints: ?- foot pain- started abut 6 months ago. Is gradually worsening. It is in her right heel. Rates pain 5/10. Pain is intermittent. Nothing makes it better or worse. She has taken tylenol randomly and that really does not help.  ?- wants to be checked for hypothyroidism- her dentist told her that her thyroid is enlarged.  ?Lab Results  ?Component Value Date  ? TSH 1.580 11/09/2021  ? ? ? ?- ADHD-. She is on adderall XR '20mg'$  daily. Sh eis doing well. Is not able to concentrate at work  she denies any medication side effects. ? ?Wt Readings from Last 3 Encounters:  ?12/22/21 235 lb (106.6 kg)  ?11/09/21 242 lb (109.8 kg)  ?08/20/21 234 lb 12.8 oz (106.5 kg)  ? ?BMI Readings from Last 3 Encounters:  ?12/22/21 36.81 kg/m?  ?11/09/21 37.90 kg/m?  ?08/20/21 36.77 kg/m?  ? ? ? ?Review of Systems  ?Constitutional:  Negative for diaphoresis.  ?Eyes:  Negative for pain.  ?Respiratory:  Negative for shortness of breath.   ?Cardiovascular:  Negative for chest pain, palpitations and leg swelling.  ?Gastrointestinal:  Negative for abdominal pain.  ?Endocrine: Negative for polydipsia.  ?Skin:  Negative for rash.  ?Neurological:  Negative for dizziness, weakness and headaches.  ?Hematological:  Does not bruise/bleed easily.  ?All other systems reviewed and are negative. ? ?   ?Objective:  ? Physical Exam ?Vitals and nursing note reviewed.  ?Constitutional:   ?   Appearance: Normal appearance.  ?Cardiovascular:  ?   Rate and Rhythm: Normal rate and regular rhythm.  ?   Heart sounds: Normal heart sounds.  ?Pulmonary:  ?   Effort: Pulmonary effort is normal.  ?   Breath sounds: Normal breath sounds.  ?Musculoskeletal:  ?   Comments: Right  heel pain on palpation  ?Skin: ?   General: Skin is warm.  ?Neurological:  ?   General: No focal deficit present.   ?   Mental Status: She is alert and oriented to person, place, and time.  ?Psychiatric:     ?   Mood and Affect: Mood normal.     ?   Behavior: Behavior normal.  ? ? ?BP 104/73   Pulse 94   Temp 97.6 ?F (36.4 ?C) (Temporal)   Resp 20   Ht '5\' 7"'$  (1.702 m)   Wt 235 lb (106.6 kg)   SpO2 97%   BMI 36.81 kg/m?  ? ? ? ?   ?Assessment & Plan:  ? ?[name] in today with chief complaint of foot and heel pain (Right foot/) and Discuss thyroid (Dentist thinks thyroid is enlarged) ? ? ?1. ADHD, adult residual type ?Stress management ?- amphetamine-dextroamphetamine (ADDERALL XR) 20 MG 24 hr capsule; Take 2 capsules (40 mg total) by mouth daily.  Dispense: 60 capsule; Refill: 0 ?- amphetamine-dextroamphetamine (ADDERALL XR) 20 MG 24 hr capsule; Take 2 capsules (40 mg total) by mouth daily.  Dispense: 60 capsule; Refill: 0 ?- amphetamine-dextroamphetamine (ADDERALL XR) 20 MG 24 hr capsule; Take 2 capsules (40 mg total) by mouth daily.  Dispense: 60 capsule; Refill: 0 ? ?2. Pain of right heel ?Ice as needed ?If not improving let me know ?- DG Foot Complete Right ?- celecoxib (CELEBREX) 200 MG capsule; Take 1 capsule (200 mg total) by mouth  2 (two) times daily.  Dispense: 60 capsule; Refill: 2 ? ?3. Morbid obesity ([place]) ?Labs pending ?Prescription sent in for wegovy waiting o insurance referral ?- Thyroid Panel With TSH ? ? ? ?The above assessment and management plan was discussed with the patient. The patient verbalized understanding of and has agreed to the management plan. Patient is aware to call the clinic if symptoms persist or worsen. Patient is aware when to return to the clinic for a follow-up visit. Patient educated on when it is appropriate to go to the emergency department.  ? ?Mary-Margaret Hassell Done, FNP ? ? ?

## 2021-12-23 ENCOUNTER — Telehealth: Payer: BC Managed Care – PPO

## 2021-12-23 NOTE — Telephone Encounter (Signed)
Sent to plan ? ?(Key: [id number]) ? ?Rx #: [id number] ? ?WGYKZL 0.'25MG'$ /0.5ML auto-injectors ?  ?Form ?[employer] Electronic PA Form [id number] NCPDP) ?

## 2021-12-24 NOTE — Telephone Encounter (Signed)
Wilhelmina Mcardle (Key: [id number]) ?Rx #: [id number] ?Wegovy 0.'25MG'$ /0.5ML auto-injectors ?  ?Form ?[employer] Electronic PA Form [phone] NCPDP) ?Created ?2 days ago ?Sent to Plan ?15 hours ago ?Plan Response ?15 hours ago ?Submit Clinical Questions ?15 hours ago ?Determination ?Unfavorable ?12 hours ago ?Your prior authorization for Mancel Parsons has been denied. ?RETURN TO DASHBOARD ?When applicable, information about how to complete an appeal for this patient will be sent to you. Please also see the determination letter provided by the payer/PBM for more information. ? ?Message from plan: Your PA request has been denied. Additional information will be provided in the denial communication. (Message 1140) ? ?? Prescriber Instructions ?

## 2021-12-29 NOTE — Telephone Encounter (Signed)
In order for [place] to try and appeal the wegovy we need a signed letter of consent from patient  ?

## 2021-12-29 NOTE — Telephone Encounter (Signed)
Letter in drawer up front for patient to sign please give to Barry or Abigail Butts when complete.  ?

## 2022-01-14 NOTE — Telephone Encounter (Signed)
Appeal denied

## 2022-01-21 NOTE — Telephone Encounter (Signed)
Can you help?

## 2022-01-21 NOTE — Telephone Encounter (Signed)
ATTEMPTING TO RE-SUBMIT PA DISCUSSED CASE WITH PCP PATIENT IS ACTIVELY TRYING TO LOSE WEIGHT AND ENGAGED IN HEALTHY LIFESTYLE MODIFICATIONS

## 2022-01-21 NOTE — Telephone Encounter (Signed)
Re-submitted PA--I may have to appeal now since PA was already done/denied, but i'll keep working on it!  It may take about 1 week or so.  Stay tuned!

## 2022-01-25 NOTE — Telephone Encounter (Signed)
Adia Bartolomei (Key: [id number]) BRAXEN 0.'25MG'$ /0.5ML auto-injectors   Form Charity fundraiser PA Form (2017 NCPDP) Created 4 days ago Sent to Plan 4 days ago Plan Response 4 days ago Submit Clinical Questions 4 days ago Determination Favorable 4 days ago Your prior authorization for Mancel Parsons has been approved! MORE INFO Personalized support and financial assistance may be available through the [employer] program. For more information, and to see program requirements, click on the More Info button to the right.  Message from plan: Your PA request has been approved. Additional information will be provided in the approval communication. (Message 1145)

## 2022-02-01 ENCOUNTER — Ambulatory Visit: Payer: BC Managed Care – PPO | Admitting: Nurse Practitioner

## 2022-02-15 MED ORDER — WEGOVY 0.25 MG/0.5ML ~~LOC~~ SOAJ: 0.5000 mg | SUBCUTANEOUS | 2 refills | Status: DC

## 2022-02-16 ENCOUNTER — Telehealth: Payer: Self-pay | Admitting: Nurse Practitioner

## 2022-02-16 MED ORDER — WEGOVY 0.5 MG/0.5ML ~~LOC~~ SOAJ: 0.5000 mg | SUBCUTANEOUS | 3 refills | Status: DC

## 2022-02-16 NOTE — Telephone Encounter (Signed)
[facility] prescription corrected

## 2022-03-15 ENCOUNTER — Other Ambulatory Visit: Payer: Self-pay | Admitting: Nurse Practitioner

## 2022-03-15 MED ORDER — WEGOVY 1 MG/0.5ML ~~LOC~~ SOAJ: 1.0000 mg | SUBCUTANEOUS | 2 refills | Status: DC

## 2022-03-15 NOTE — Addendum Note (Signed)
Addended by: Chevis Pretty on: 03/15/2022 03:39 PM   Modules accepted: Orders

## 2022-03-15 NOTE — Telephone Encounter (Signed)
Increased wegovy dose to '1mg'$  weekly- prescription was sent to pharmacy  Meds ordered this encounter  Medications   DISCONTD: Semaglutide-Weight Management (WEGOVY) 0.25 MG/0.5ML SOAJ    Sig: Inject 0.5 mg into the skin once a week.    Dispense:  2 mL    Refill:  2    Order Specific Question:   Supervising Provider    Answer:   Caryl Pina A [[id number]]   Semaglutide-Weight Management (WEGOVY) 1 MG/0.5ML SOAJ    Sig: Inject 1 mg into the skin once a week.    Dispense:  2 mL    Refill:  2    Order Specific Question:   Supervising Provider    Answer:   Worthy Rancher [[id number]]   [place], FNP

## 2022-04-20 ENCOUNTER — Ambulatory Visit: Payer: BC Managed Care – PPO | Admitting: Nurse Practitioner

## 2022-04-21 ENCOUNTER — Encounter: Payer: Self-pay | Admitting: Nurse Practitioner

## 2022-04-29 ENCOUNTER — Encounter: Payer: Self-pay | Admitting: Nurse Practitioner

## 2022-04-29 ENCOUNTER — Ambulatory Visit: Payer: BC Managed Care – PPO | Admitting: Nurse Practitioner

## 2022-04-29 VITALS — BP 106/72 | HR 93 | Temp 97.7°F | Resp 20 | Ht 67.0 in | Wt 214.0 lb

## 2022-04-29 DIAGNOSIS — F908 Attention-deficit hyperactivity disorder, other type: Secondary | ICD-10-CM

## 2022-04-29 MED ORDER — WEGOVY 1.7 MG/0.75ML ~~LOC~~ SOAJ: 1.7000 mg | SUBCUTANEOUS | 1 refills | Status: DC

## 2022-04-29 MED ORDER — AMPHETAMINE-DEXTROAMPHET ER 20 MG PO CP24: 40.0000 mg | ORAL_CAPSULE | Freq: Every day | ORAL | 0 refills | Status: DC

## 2022-04-29 NOTE — Patient Instructions (Signed)
Exercising to Stay Healthy To become healthy and stay healthy, it is recommended that you do moderate-intensity and vigorous-intensity exercise. You can tell that you are exercising at a moderate intensity if your heart starts beating faster and you start breathing faster but can still hold a conversation. You can tell that you are exercising at a vigorous intensity if you are breathing much harder and faster and cannot hold a conversation while exercising. How can exercise benefit me? Exercising regularly is important. It has many health benefits, such as: Improving overall fitness, flexibility, and endurance. Increasing bone density. Helping with weight control. Decreasing body fat. Increasing muscle strength and endurance. Reducing stress and tension, anxiety, depression, or anger. Improving overall health. What guidelines should I follow while exercising? Before you start a new exercise program, talk with your health care provider. Do not exercise so much that you hurt yourself, feel dizzy, or get very short of breath. Wear comfortable clothes and wear shoes with good support. Drink plenty of water while you exercise to prevent dehydration or heat stroke. Work out until your breathing and your heartbeat get faster (moderate intensity). How often should I exercise? Choose an activity that you enjoy, and set realistic goals. Your health care provider can help you make an activity plan that is individually designed and works best for you. Exercise regularly as told by your health care provider. This may include: Doing strength training two times a week, such as: Lifting weights. Using resistance bands. Push-ups. Sit-ups. Yoga. Doing a certain intensity of exercise for a given amount of time. Choose from these options: A total of 150 minutes of moderate-intensity exercise every week. A total of 75 minutes of vigorous-intensity exercise every week. A mix of moderate-intensity and  vigorous-intensity exercise every week. Children, pregnant women, people who have not exercised regularly, people who are overweight, and older adults may need to talk with a health care provider about what activities are safe to perform. If you have a medical condition, be sure to talk with your health care provider before you start a new exercise program. What are some exercise ideas? Moderate-intensity exercise ideas include: Walking 1 mile (1.6 km) in about 15 minutes. Biking. Hiking. Golfing. Dancing. Water aerobics. Vigorous-intensity exercise ideas include: Walking 4.5 miles (7.2 km) or more in about 1 hour. Jogging or running 5 miles (8 km) in about 1 hour. Biking 10 miles (16.1 km) or more in about 1 hour. Lap swimming. Roller-skating or in-line skating. Cross-country skiing. Vigorous competitive sports, such as football, basketball, and soccer. Jumping rope. Aerobic dancing. What are some everyday activities that can help me get exercise? Yard work, such as: Pushing a lawn mower. Raking and bagging leaves. Washing your car. Pushing a stroller. Shoveling snow. Gardening. Washing windows or floors. How can I be more active in my day-to-day activities? Use stairs instead of an elevator. Take a walk during your lunch break. If you drive, park your car farther away from your work or school. If you take public transportation, get off one stop early and walk the rest of the way. Stand up or walk around during all of your indoor phone calls. Get up, stretch, and walk around every 30 minutes throughout the day. Enjoy exercise with a friend. Support to continue exercising will help you keep a regular routine of activity. Where to find more information You can find more information about exercising to stay healthy from: U.S. Department of Health and Human Services: www.hhs.gov Centers for Disease Control and Prevention (  CDC): www.cdc.gov Summary Exercising regularly is  important. It will improve your overall fitness, flexibility, and endurance. Regular exercise will also improve your overall health. It can help you control your weight, reduce stress, and improve your bone density. Do not exercise so much that you hurt yourself, feel dizzy, or get very short of breath. Before you start a new exercise program, talk with your health care provider. This information is not intended to replace advice given to you by your health care provider. Make sure you discuss any questions you have with your health care provider. Document Revised: 12/05/2020 Document Reviewed: 12/05/2020 Elsevier Patient Education  2023 Elsevier Inc.  

## 2022-04-29 NOTE — Progress Notes (Signed)
   Subjective:    Patient ID: Stephanie Buckley, female    DOB: [birth year], 46 y.o.   MRN: [id number]   Chief Complaint: ADHD   HPI Patient come sin  to day for follow up of ADHD. She has been on meds for many years. She is not able to concentrate at work without meds. Is currently on adderall xr '20mg'$  BID  Obesity- has been on wegovy for several moinths. Weight is coming down. Weight is down 21 lbs  Wt Readings from Last 3 Encounters:  04/29/22 214 lb (97.1 kg)  01/21/22 235 lb (106.6 kg)  12/22/21 235 lb (106.6 kg)   BMI Readings from Last 3 Encounters:  04/29/22 33.52 kg/m  01/21/22 36.81 kg/m  12/22/21 36.81 kg/m     Review of Systems  Constitutional:  Negative for diaphoresis.  Eyes:  Negative for pain.  Respiratory:  Negative for shortness of breath.   Cardiovascular:  Negative for chest pain, palpitations and leg swelling.  Gastrointestinal:  Negative for abdominal pain.  Endocrine: Negative for polydipsia.  Skin:  Negative for rash.  Neurological:  Negative for dizziness, weakness and headaches.  Hematological:  Does not bruise/bleed easily.  All other systems reviewed and are negative.      Objective:   Physical Exam Vitals reviewed.  Constitutional:      Appearance: Normal appearance.  Cardiovascular:     Rate and Rhythm: Normal rate and regular rhythm.     Heart sounds: Normal heart sounds.  Pulmonary:     Effort: Pulmonary effort is normal.     Breath sounds: Normal breath sounds.  Skin:    General: Skin is warm.  Neurological:     General: No focal deficit present.     Mental Status: She is alert and oriented to person, place, and time.  Psychiatric:        Mood and Affect: Mood normal.        Behavior: Behavior normal.    BP 106/72   Pulse 93   Temp 97.7 F (36.5 C) (Temporal)   Resp 20   Ht '5\' 7"'$  (1.702 m)   Wt 214 lb (97.1 kg)   SpO2 97%   BMI 33.52 kg/m         Assessment & Plan:   [name] in today with chief complaint  of ADHD   1. ADHD, adult residual type Stress management - amphetamine-dextroamphetamine (ADDERALL XR) 20 MG 24 hr capsule; Take 2 capsules (40 mg total) by mouth daily.  Dispense: 60 capsule; Refill: 0 - amphetamine-dextroamphetamine (ADDERALL XR) 20 MG 24 hr capsule; Take 2 capsules (40 mg total) by mouth daily.  Dispense: 60 capsule; Refill: 0 - amphetamine-dextroamphetamine (ADDERALL XR) 20 MG 24 hr capsule; Take 2 capsules (40 mg total) by mouth daily.  Dispense: 60 capsule; Refill: 0  2. Morbid obesity (HCC) exercise - Semaglutide-Weight Management (WEGOVY) 1.7 MG/0.75ML SOAJ; Inject 1.7 mg into the skin once a week.  Dispense: 3 mL; Refill: 1    The above assessment and management plan was discussed with the patient. The patient verbalized understanding of and has agreed to the management plan. Patient is aware to call the clinic if symptoms persist or worsen. Patient is aware when to return to the clinic for a follow-up visit. Patient educated on when it is appropriate to go to the emergency department.   Mary-Margaret Hassell Done, FNP

## 2022-05-05 ENCOUNTER — Ambulatory Visit: Payer: BC Managed Care – PPO | Admitting: Family Medicine

## 2022-05-05 ENCOUNTER — Encounter: Payer: Self-pay | Admitting: Family Medicine

## 2022-05-05 VITALS — BP 108/80 | HR 99 | Temp 97.6°F | Ht 67.0 in | Wt 212.2 lb

## 2022-05-05 DIAGNOSIS — Y7711 Contact lens associated with adverse incidents: Secondary | ICD-10-CM

## 2022-05-05 DIAGNOSIS — H1089 Other conjunctivitis: Secondary | ICD-10-CM

## 2022-05-05 MED ORDER — MOXIFLOXACIN HCL 0.5 % OP SOLN: 1.0000 [drp] | OPHTHALMIC | 0 refills | Status: DC

## 2022-05-05 NOTE — Progress Notes (Signed)
Subjective: CC: [facility] PCP: Chevis Pretty, FNP Stephanie Buckley is a 46 y.o. female presenting to clinic today for:  1.  Pink eye Patient reports 1 day history of a left-sided pinkeye.  She reports pain and swelling of the eye.  She is not sure if she has a stye trying to form but notes the actual globe of the eye hurts as well.  She does not report any double vision, loss of vision.  She has some blurred vision at baseline but this does not seem to be exacerbated.  She admits that she does wear contact lenses it is not clean nor changes out regularly.  She is overdue for an appointment with her eye doctor, Dr. Marin Comment.  No fevers.   ROS: Per HPI  Allergies  Allergen Reactions   Keflex [Cephalexin] Rash   Past Medical History:  Diagnosis Date   ADHD (attention deficit hyperactivity disorder)     Current Outpatient Medications:    [START ON 07/23/2022] amphetamine-dextroamphetamine (ADDERALL XR) 20 MG 24 hr capsule, Take 2 capsules (40 mg total) by mouth daily., Disp: 60 capsule, Rfl: 0   [START ON 06/23/2022] amphetamine-dextroamphetamine (ADDERALL XR) 20 MG 24 hr capsule, Take 2 capsules (40 mg total) by mouth daily., Disp: 60 capsule, Rfl: 0   [START ON 05/24/2022] amphetamine-dextroamphetamine (ADDERALL XR) 20 MG 24 hr capsule, Take 2 capsules (40 mg total) by mouth daily., Disp: 60 capsule, Rfl: 0   moxifloxacin (VIGAMOX) 0.5 % ophthalmic solution, Place 1 drop into the left eye every hour., Disp: 3 mL, Rfl: 0   Semaglutide-Weight Management (WEGOVY) 1.7 MG/0.75ML SOAJ, Inject 1.7 mg into the skin once a week., Disp: 3 mL, Rfl: 1 Social History   Socioeconomic History   Marital status: Married    Spouse name: Not on file   Number of children: Not on file   Years of education: Not on file   Highest education level: Not on file  Occupational History   Not on file  Tobacco Use   Smoking status: Every Day    Packs/day: 1.50    Types: Cigarettes   Smokeless tobacco:  Never  Substance and Sexual Activity   Alcohol use: Yes   Drug use: No   Sexual activity: Not on file  Other Topics Concern   Not on file  Social History Narrative   Not on file   Social Determinants of Health   Financial Resource Strain: Not on file  Food Insecurity: Not on file  Transportation Needs: Not on file  Physical Activity: Not on file  Stress: Not on file  Social Connections: Not on file  Intimate Partner Violence: Not on file   Family History  Problem Relation Age of Onset   Bipolar disorder Father     Objective: Office vital signs reviewed. BP 108/80   Pulse 99   Temp 97.6 F (36.4 C) (Temporal)   Ht '5\' 7"'$  (1.702 m)   Wt 212 lb 3.2 oz (96.3 kg)   LMP 04/07/2022 (Approximate)   SpO2 97%   BMI 33.24 kg/m   Physical Examination:  General: Awake, alert, well nourished, No acute distress HEENT: EOMI, PERRLA.  She has conjunctival injection throughout the sclera on the left.  She has some mild irritation of the lower lid medially noted.  No appreciable abrasions or foreign body  Assessment/ Plan: 46 y.o. female   Contact lens related conjunctivitis - Plan: moxifloxacin (VIGAMOX) 0.5 % ophthalmic solution  I am worried about the missed use of  contact lenses in this patient and the conjunctivitis that is now present.  I am going to place her on moxifloxacin every hour and have highly recommended that she see her eye doctor ASAP.  I have also reached out to the eye doctor as Juluis Rainier have issue.  We discussed risks for pseudomonal infection, etc.  No orders of the defined types were placed in this encounter.  Meds ordered this encounter  Medications   moxifloxacin (VIGAMOX) 0.5 % ophthalmic solution    Sig: Place 1 drop into the left eye every hour.    Dispense:  3 mL    Refill:  Bono, DO [place] [phone]

## 2022-05-05 NOTE — Patient Instructions (Addendum)
I am treating you for a contact lens conjunctivitis.   I HIGHLY recommend that you see an eye doctor in the next 24-48 hours for further evaluation, as certain bacteria associated with contact lens misuse can cause significant issues in the eye

## 2022-08-09 ENCOUNTER — Encounter: Payer: Self-pay | Admitting: Nurse Practitioner

## 2022-08-09 ENCOUNTER — Ambulatory Visit: Payer: BC Managed Care – PPO | Admitting: Nurse Practitioner

## 2022-08-09 VITALS — BP 91/65 | HR 98 | Temp 97.6°F | Resp 20 | Ht 67.0 in

## 2022-08-09 DIAGNOSIS — F908 Attention-deficit hyperactivity disorder, other type: Secondary | ICD-10-CM | POA: Diagnosis not present

## 2022-08-09 DIAGNOSIS — Z6833 Body mass index (BMI) 33.0-33.9, adult: Secondary | ICD-10-CM | POA: Diagnosis not present

## 2022-08-09 MED ORDER — AMPHETAMINE-DEXTROAMPHET ER 20 MG PO CP24: 40.0000 mg | ORAL_CAPSULE | Freq: Every day | ORAL | 0 refills | Status: DC

## 2022-08-09 MED ORDER — WEGOVY 2.4 MG/0.75ML ~~LOC~~ SOAJ: 2.4000 mg | SUBCUTANEOUS | 3 refills | Status: DC

## 2022-08-09 MED ORDER — OSELTAMIVIR PHOSPHATE 75 MG PO CAPS: 75.0000 mg | ORAL_CAPSULE | Freq: Every day | ORAL | 0 refills | Status: DC

## 2022-08-09 NOTE — Progress Notes (Signed)
Subjective:    Patient ID: Stephanie Buckley, female    DOB: Jun 26, 1976, 46 y.o.   MRN: [id number]   Chief Complaint: ADHD    HPI:  Stephanie Buckley is a 46 y.o. who identifies as a female who was assigned female at birth.   Social history: Lives with: husband and kids Work history: school system   Comes in today for follow up of the following chronic medical issues:  1. ADHD, adult residual type Is on adderall '20mg'$  dailly. Is not able to concentrate at work without medication.  2. BMI 33.0-33.9 Wt Readings from Last 3 Encounters:  05/05/22 212 lb 3.2 oz (96.3 kg)  04/29/22 214 lb (97.1 kg)  01/21/22 235 lb (106.6 kg)   BMI Readings from Last 3 Encounters:  08/09/22 33.24 kg/m  05/05/22 33.24 kg/m  04/29/22 33.52 kg/m    New complaints: None today  Allergies  Allergen Reactions   Keflex [Cephalexin] Rash   Outpatient Encounter Medications as of 08/09/2022  Medication Sig   amphetamine-dextroamphetamine (ADDERALL XR) 20 MG 24 hr capsule Take 2 capsules (40 mg total) by mouth daily.   Semaglutide-Weight Management (WEGOVY) 1.7 MG/0.75ML SOAJ Inject 1.7 mg into the skin once a week.   amphetamine-dextroamphetamine (ADDERALL XR) 20 MG 24 hr capsule Take 2 capsules (40 mg total) by mouth daily.   amphetamine-dextroamphetamine (ADDERALL XR) 20 MG 24 hr capsule Take 2 capsules (40 mg total) by mouth daily.   [DISCONTINUED] moxifloxacin (VIGAMOX) 0.5 % ophthalmic solution Place 1 drop into the left eye every hour.   No facility-administered encounter medications on file as of 08/09/2022.    History reviewed. No pertinent surgical history.  Family History  Problem Relation Age of Onset   Bipolar disorder Father       Controlled substance contract: n/a     Review of Systems  Constitutional:  Negative for diaphoresis.  Eyes:  Negative for pain.  Respiratory:  Negative for shortness of breath.   Cardiovascular:  Negative for chest pain, palpitations and leg  swelling.  Gastrointestinal:  Negative for abdominal pain.  Endocrine: Negative for polydipsia.  Skin:  Negative for rash.  Neurological:  Negative for dizziness, weakness and headaches.  Hematological:  Does not bruise/bleed easily.  All other systems reviewed and are negative.      Objective:   Physical Exam Vitals and nursing note reviewed.  Constitutional:      General: She is not in acute distress.    Appearance: Normal appearance. She is well-developed.  HENT:     Head: Normocephalic.     Right Ear: Tympanic membrane normal.     Left Ear: Tympanic membrane normal.     Nose: Nose normal.     Mouth/Throat:     Mouth: Mucous membranes are moist.  Eyes:     Pupils: Pupils are equal, round, and reactive to light.  Neck:     Vascular: No carotid bruit or JVD.  Cardiovascular:     Rate and Rhythm: Normal rate and regular rhythm.     Heart sounds: Normal heart sounds.  Pulmonary:     Effort: Pulmonary effort is normal. No respiratory distress.     Breath sounds: Normal breath sounds. No wheezing or rales.  Chest:     Chest wall: No tenderness.  Abdominal:     General: Bowel sounds are normal. There is no distension or abdominal bruit.     Palpations: Abdomen is soft. There is no hepatomegaly, splenomegaly, mass or pulsatile mass.  Tenderness: There is no abdominal tenderness.  Musculoskeletal:        General: Normal range of motion.     Cervical back: Normal range of motion and neck supple.  Lymphadenopathy:     Cervical: No cervical adenopathy.  Skin:    General: Skin is warm and dry.  Neurological:     Mental Status: She is alert and oriented to person, place, and time.     Deep Tendon Reflexes: Reflexes are normal and symmetric.  Psychiatric:        Behavior: Behavior normal.        Thought Content: Thought content normal.        Judgment: Judgment normal.     BP 91/65   Pulse 98   Temp 97.6 F (36.4 C) (Temporal)   Resp 20   Ht '5\' 7"'$  (1.702 m)   SpO2  96%   BMI 33.24 kg/m        Assessment & Plan:   Stephanie Buckley comes in today with chief complaint of ADHD   Diagnosis and orders addressed:  1. ADHD, adult residual type Stress management - amphetamine-dextroamphetamine (ADDERALL XR) 20 MG 24 hr capsule; Take 2 capsules (40 mg total) by mouth daily.  Dispense: 60 capsule; Refill: 0 - amphetamine-dextroamphetamine (ADDERALL XR) 20 MG 24 hr capsule; Take 2 capsules (40 mg total) by mouth daily.  Dispense: 60 capsule; Refill: 0 - amphetamine-dextroamphetamine (ADDERALL XR) 20 MG 24 hr capsule; Take 2 capsules (40 mg total) by mouth daily.  Dispense: 60 capsule; Refill: 0  2. BMI 33.0-33.9,adult exercise - Semaglutide-Weight Management (WEGOVY) 2.4 MG/0.75ML SOAJ; Inject 2.4 mg into the skin once a week.  Dispense: 3 mL; Refill: 3   Labs pending Health Maintenance reviewed Diet and exercise encouraged  Follow up plan: 3 months   Mary-Margaret Hassell Done, FNP

## 2022-08-09 NOTE — Patient Instructions (Signed)

## 2022-10-19 ENCOUNTER — Telehealth: Payer: Self-pay | Admitting: Nurse Practitioner

## 2022-10-19 NOTE — Telephone Encounter (Signed)
Please review

## 2022-10-19 NOTE — Telephone Encounter (Signed)
Aaron Edelman called from [facility] pharmacy to let our PA team know that patients Adderrall requires PA and remind them that their e-script network is down at this time.

## 2022-10-21 NOTE — Telephone Encounter (Signed)
Stephanie Buckley (Key: [id number])Need help? Call us at [phone] Out[employer] Shared Your PA request cannot be processed electronically. The drug(s) you requested is not supported by the electronic process. For further inquiries please contact the number on the back of the member prescription card. (Message [phone]) Drug Amphetamine-Dextroamphet ER '20MG'$  er capsules Form [facility] Non-Medicare Formulary Exception / Prior Authorization Request Form Prior Authorization for [facility] non-Medicare members (NOT FOR MEDICARE PATIENTS) [phone] [phone]

## 2022-10-22 NOTE — Telephone Encounter (Signed)
Left detailed message on patients voicemail that [facility] will not allow the PA to be done electronically and it must be done by phone. Advised patient it could take 7-14 days.

## 2022-10-22 NOTE — Telephone Encounter (Signed)
Pt calling for an update on PA. She wants to know why it is taking so long

## 2022-10-22 NOTE — Telephone Encounter (Signed)
Pt checking on status of this PA, she needs this medication by tomorrow please call back

## 2022-10-26 ENCOUNTER — Other Ambulatory Visit (HOSPITAL_COMMUNITY): Payer: Self-pay

## 2022-10-26 NOTE — Telephone Encounter (Signed)
[employer] and faxed PA form to [facility]. Awaiting decision

## 2022-10-29 ENCOUNTER — Ambulatory Visit: Payer: BC Managed Care – PPO | Admitting: Nurse Practitioner

## 2022-11-01 ENCOUNTER — Encounter: Payer: Self-pay | Admitting: Nurse Practitioner

## 2022-11-01 ENCOUNTER — Ambulatory Visit: Payer: BC Managed Care – PPO | Admitting: Nurse Practitioner

## 2022-11-01 ENCOUNTER — Telehealth: Payer: Self-pay

## 2022-11-01 VITALS — BP 122/80 | HR 91 | Temp 97.6°F | Resp 20 | Ht 67.0 in | Wt 195.0 lb

## 2022-11-01 DIAGNOSIS — F908 Attention-deficit hyperactivity disorder, other type: Secondary | ICD-10-CM | POA: Diagnosis not present

## 2022-11-01 MED ORDER — AMPHETAMINE-DEXTROAMPHET ER 20 MG PO CP24: 40.0000 mg | ORAL_CAPSULE | Freq: Every day | ORAL | 0 refills | Status: DC

## 2022-11-01 NOTE — Telephone Encounter (Signed)
Patient calling about prior authorization for Adderall. [employer] says that instead of online we have to contact them by phone. Please review and advise

## 2022-11-01 NOTE — Progress Notes (Signed)
Subjective:    Patient ID: Stephanie Buckley, female    DOB: May 18, 1976, 47 y.o.   MRN: [id number]   Chief Complaint: ADHD   HPI  Patient Active Problem List   Diagnosis Date Noted   ADHD, adult residual type 05/11/2013   Patient in for ADHD follow up. She has been on adderall for several years and works well for her. No medication side effects. She has had trouble getting meds due to denied prior auth.     Review of Systems  Constitutional:  Negative for diaphoresis.  Eyes:  Negative for pain.  Respiratory:  Negative for shortness of breath.   Cardiovascular:  Negative for chest pain, palpitations and leg swelling.  Gastrointestinal:  Negative for abdominal pain.  Endocrine: Negative for polydipsia.  Skin:  Negative for rash.  Neurological:  Negative for dizziness, weakness and headaches.  Hematological:  Does not bruise/bleed easily.  All other systems reviewed and are negative.      Objective:   Physical Exam Vitals and nursing note reviewed.  Constitutional:      General: She is not in acute distress.    Appearance: Normal appearance. She is well-developed.  Neck:     Vascular: No carotid bruit or JVD.  Cardiovascular:     Rate and Rhythm: Normal rate and regular rhythm.     Heart sounds: Normal heart sounds.  Pulmonary:     Effort: Pulmonary effort is normal. No respiratory distress.     Breath sounds: Normal breath sounds. No wheezing or rales.  Chest:     Chest wall: No tenderness.  Abdominal:     General: Bowel sounds are normal. There is no distension or abdominal bruit.     Palpations: Abdomen is soft. There is no hepatomegaly, splenomegaly, mass or pulsatile mass.     Tenderness: There is no abdominal tenderness.  Musculoskeletal:        General: Normal range of motion.     Cervical back: Normal range of motion and neck supple.  Lymphadenopathy:     Cervical: No cervical adenopathy.  Skin:    General: Skin is warm and dry.  Neurological:     Mental  Status: She is alert and oriented to person, place, and time.     Deep Tendon Reflexes: Reflexes are normal and symmetric.  Psychiatric:        Behavior: Behavior normal.        Thought Content: Thought content normal.        Judgment: Judgment normal.    BP 122/80   Pulse 91   Temp 97.6 F (36.4 C) (Temporal)   Resp 20   Ht '5\' 7"'$  (1.702 m)   Wt 195 lb (88.5 kg)   SpO2 98%   BMI 30.54 kg/m         Assessment & Plan:   [name] in today with chief complaint of ADHD   1. ADHD, adult residual type Stress management Will check on prior auth - amphetamine-dextroamphetamine (ADDERALL XR) 20 MG 24 hr capsule; Take 2 capsules (40 mg total) by mouth daily.  Dispense: 60 capsule; Refill: 0 - amphetamine-dextroamphetamine (ADDERALL XR) 20 MG 24 hr capsule; Take 2 capsules (40 mg total) by mouth daily.  Dispense: 60 capsule; Refill: 0 - amphetamine-dextroamphetamine (ADDERALL XR) 20 MG 24 hr capsule; Take 2 capsules (40 mg total) by mouth daily.  Dispense: 60 capsule; Refill: 0    The above assessment and management plan was discussed with the patient. The  patient verbalized understanding of and has agreed to the management plan. Patient is aware to call the clinic if symptoms persist or worsen. Patient is aware when to return to the clinic for a follow-up visit. Patient educated on when it is appropriate to go to the emergency department.   Mary-Margaret Hassell Done, FNP

## 2022-11-03 NOTE — Telephone Encounter (Signed)
PA denied.

## 2022-11-03 NOTE — Telephone Encounter (Signed)
PA was denied  Pharmacy Patient Advocate Encounter  Received notification from [facility] that the request for prior authorization for Amphetamine-Dextroamphetamine has been denied due to  .    Please be advised we currently do not have a Pharmacist to review denials, therefore you will need to process appeals accordingly as needed. Thanks for your support at this time.   You may call [phone] or fax [phone], to appeal.

## 2022-11-04 NOTE — Telephone Encounter (Signed)
Pease have patient call and see what insurance will pay for and let me know.

## 2022-12-02 ENCOUNTER — Other Ambulatory Visit (HOSPITAL_COMMUNITY): Payer: Self-pay

## 2022-12-02 ENCOUNTER — Telehealth: Payer: Self-pay

## 2022-12-02 NOTE — Telephone Encounter (Signed)
Pharmacy Patient Advocate Encounter   Received notification that prior authorization for Amphetamine-Dextroamphet ER 20MG  er capsules is required/requested.  Per Test Claim: PA required   PA submitted on 12/02/22 to (ins) [employer] via CoverMyMeds [employer] or [facility]) confirmation # [id number] Status is pending

## 2022-12-02 NOTE — Telephone Encounter (Signed)
Patient Advocate Encounter  Prior Authorization for Amphetamine-Dextroamphet ER 20MG  er capsules  has been approved.    PA# [id number] Effective dates: 12/02/22 through 12/01/25

## 2022-12-08 ENCOUNTER — Other Ambulatory Visit (HOSPITAL_COMMUNITY): Payer: Self-pay

## 2023-02-02 ENCOUNTER — Ambulatory Visit: Payer: BC Managed Care – PPO | Admitting: Family Medicine

## 2023-02-04 ENCOUNTER — Ambulatory Visit: Payer: BC Managed Care – PPO | Admitting: Nurse Practitioner

## 2023-02-04 VITALS — BP 100/71 | HR 88 | Temp 97.4°F | Resp 20 | Ht 67.0 in | Wt 208.0 lb

## 2023-02-04 DIAGNOSIS — F908 Attention-deficit hyperactivity disorder, other type: Secondary | ICD-10-CM

## 2023-02-04 MED ORDER — AMPHETAMINE-DEXTROAMPHET ER 20 MG PO CP24
40.0000 mg | ORAL_CAPSULE | Freq: Every day | ORAL | 0 refills | Status: DC
Start: 2023-02-04 — End: 2023-05-06

## 2023-02-04 MED ORDER — AMPHETAMINE-DEXTROAMPHET ER 20 MG PO CP24
40.0000 mg | ORAL_CAPSULE | Freq: Every day | ORAL | 0 refills | Status: DC
Start: 2023-03-06 — End: 2023-05-06

## 2023-02-04 MED ORDER — AMPHETAMINE-DEXTROAMPHET ER 20 MG PO CP24
40.0000 mg | ORAL_CAPSULE | Freq: Every day | ORAL | 0 refills | Status: DC
Start: 2023-04-05 — End: 2023-05-06

## 2023-02-04 NOTE — Progress Notes (Signed)
Subjective:    Patient ID: Stephanie Buckley, female    DOB: 1976/08/22, 47 y.o.   MRN: [id number]   Chief Complaint: ADHD  HPI  ADHD Patient has had issues with  this since she was a teenager. Mainly attention issues. 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 She is on adderall. Denies any medication side effects.  Patient Active Problem List   Diagnosis Date Noted   ADHD, adult residual type 05/11/2013       Review of Systems  Constitutional:  Negative for diaphoresis.  Eyes:  Negative for pain.  Respiratory:  Negative for shortness of breath.   Cardiovascular:  Negative for chest pain, palpitations and leg swelling.  Gastrointestinal:  Negative for abdominal pain.  Endocrine: Negative for polydipsia.  Skin:  Negative for rash.  Neurological:  Negative for dizziness, weakness and headaches.  Hematological:  Does not bruise/bleed easily.  All other systems reviewed and are negative.      Objective:   Physical Exam Vitals and nursing note reviewed.  Constitutional:      General: She is not in acute distress.    Appearance: Normal appearance. She is well-developed.  Neck:     Vascular: No carotid bruit or JVD.  Cardiovascular:     Rate and Rhythm: Normal rate and regular rhythm.     Heart sounds: Normal heart sounds.  Pulmonary:     Effort: Pulmonary effort is normal. No respiratory distress.     Breath sounds: Normal breath sounds. No wheezing or rales.  Chest:     Chest wall: No tenderness.  Abdominal:     General: There is no distension or abdominal bruit.     Palpations: There is no hepatomegaly, splenomegaly, mass or pulsatile mass.     Tenderness: There is no abdominal tenderness.  Musculoskeletal:        General: Normal range of motion.     Cervical back: Normal range of motion and neck supple.  Lymphadenopathy:     Cervical: No cervical adenopathy.  Skin:    General: Skin is warm and dry.  Neurological:      Mental Status: She is alert and oriented to person, place, and time.     Deep Tendon Reflexes: Reflexes are normal and symmetric.  Psychiatric:        Behavior: Behavior normal.        Thought Content: Thought content normal.        Judgment: Judgment normal.     BP 100/71   Pulse 88   Temp (!) 97.4 F (36.3 C) (Temporal)   Resp 20   Ht 5\' 7"  (1.702 m)   Wt 208 lb (94.3 kg)   SpO2 100%   BMI 32.58 kg/m         Assessment & Plan:   Stephanie Buckley in today with chief complaint of ADHD   1. ADHD, adult residual type Stress management - amphetamine-dextroamphetamine (ADDERALL XR) 20 MG 24 hr capsule; Take 2 capsules (40 mg total) by mouth daily.  Dispense: 60 capsule; Refill: 0 - amphetamine-dextroamphetamine (ADDERALL XR) 20 MG 24 hr capsule; Take 2 capsules (40 mg total) by mouth daily.  Dispense: 60 capsule; Refill: 0 - amphetamine-dextroamphetamine (ADDERALL XR) 20 MG 24 hr capsule; Take 2 capsules (40 mg total) by mouth daily.  Dispense: 60 capsule; Refill: 0 - ToxASSURE Select 13 (MW), Urine    The above assessment and management plan was discussed with the  patient. The patient verbalized understanding of and has agreed to the management plan. Patient is aware to call the clinic if symptoms persist or worsen. Patient is aware when to return to the clinic for a follow-up visit. Patient educated on when it is appropriate to go to the emergency department.   Mary-Margaret Hassell Done, FNP

## 2023-02-09 LAB — TOXASSURE SELECT 13 (MW), URINE

## 2023-05-05 ENCOUNTER — Ambulatory Visit: Payer: BC Managed Care – PPO | Admitting: Nurse Practitioner

## 2023-05-06 ENCOUNTER — Ambulatory Visit (INDEPENDENT_AMBULATORY_CARE_PROVIDER_SITE_OTHER): Payer: BC Managed Care – PPO | Admitting: Nurse Practitioner

## 2023-05-06 ENCOUNTER — Encounter: Payer: Self-pay | Admitting: Nurse Practitioner

## 2023-05-06 VITALS — BP 119/85 | HR 94 | Temp 97.4°F | Resp 20 | Ht 67.0 in | Wt 216.0 lb

## 2023-05-06 DIAGNOSIS — F908 Attention-deficit hyperactivity disorder, other type: Secondary | ICD-10-CM | POA: Diagnosis not present

## 2023-05-06 MED ORDER — AMPHETAMINE-DEXTROAMPHET ER 20 MG PO CP24
40.0000 mg | ORAL_CAPSULE | Freq: Every day | ORAL | 0 refills | Status: DC
Start: 2023-07-05 — End: 2023-08-04

## 2023-05-06 MED ORDER — AMPHETAMINE-DEXTROAMPHET ER 20 MG PO CP24
40.0000 mg | ORAL_CAPSULE | Freq: Every day | ORAL | 0 refills | Status: DC
Start: 2023-05-06 — End: 2023-08-04

## 2023-05-06 MED ORDER — AMPHETAMINE-DEXTROAMPHET ER 20 MG PO CP24
40.0000 mg | ORAL_CAPSULE | Freq: Every day | ORAL | 0 refills | Status: DC
Start: 2023-06-05 — End: 2023-08-04

## 2023-05-06 NOTE — Progress Notes (Signed)
Subjective:    Patient ID: Stephanie Buckley, female    DOB: [birth year], 47 y.o.   MRN: [id number]   Chief Complaint: ADHD follow up  HPI  Patient has had issues with concentration her whole life. She has been on adderall for many years. Cannot concentrate and get work done without meds. She denies any medication side effect.  Patient Active Problem List   Diagnosis Date Noted   ADHD, adult residual type 05/11/2013       Review of Systems  Constitutional:  Negative for diaphoresis.  Eyes:  Negative for pain.  Respiratory:  Negative for shortness of breath.   Cardiovascular:  Negative for chest pain, palpitations and leg swelling.  Gastrointestinal:  Negative for abdominal pain.  Endocrine: Negative for polydipsia.  Skin:  Negative for rash.  Neurological:  Negative for dizziness, weakness and headaches.  Hematological:  Does not bruise/bleed easily.  All other systems reviewed and are negative.      Objective:   Physical Exam Vitals and nursing note reviewed.  Constitutional:      General: She is not in acute distress.    Appearance: Normal appearance. She is well-developed.  Neck:     Vascular: No carotid bruit or JVD.  Cardiovascular:     Rate and Rhythm: Normal rate and regular rhythm.     Heart sounds: Normal heart sounds.  Pulmonary:     Effort: Pulmonary effort is normal. No respiratory distress.     Breath sounds: Normal breath sounds. No wheezing or rales.  Chest:     Chest wall: No tenderness.  Abdominal:     General: Bowel sounds are normal. There is no distension or abdominal bruit.     Palpations: Abdomen is soft. There is no hepatomegaly, splenomegaly, mass or pulsatile mass.     Tenderness: There is no abdominal tenderness.  Musculoskeletal:        General: Normal range of motion.     Cervical back: Normal range of motion and neck supple.  Lymphadenopathy:     Cervical: No cervical adenopathy.  Skin:    General: Skin is warm and dry.   Neurological:     Mental Status: She is alert and oriented to person, place, and time.     Deep Tendon Reflexes: Reflexes are normal and symmetric.  Psychiatric:        Behavior: Behavior normal.        Thought Content: Thought content normal.        Judgment: Judgment normal.    BP 119/85   Pulse 94   Temp (!) 97.4 F (36.3 C) (Temporal)   Resp 20   Ht 5\' 7"  (1.702 m)   Wt 216 lb (98 kg)   SpO2 97%   BMI 33.83 kg/m         Assessment & Plan:   Stephanie Buckley in today with chief complaint of ADHD   1. ADHD, adult residual type Stress management  - amphetamine-dextroamphetamine (ADDERALL XR) 20 MG 24 hr capsule; Take 2 capsules (40 mg total) by mouth daily.  Dispense: 60 capsule; Refill: 0 - amphetamine-dextroamphetamine (ADDERALL XR) 20 MG 24 hr capsule; Take 2 capsules (40 mg total) by mouth daily.  Dispense: 60 capsule; Refill: 0 - amphetamine-dextroamphetamine (ADDERALL XR) 20 MG 24 hr capsule; Take 2 capsules (40 mg total) by mouth daily.  Dispense: 60 capsule; Refill: 0    The above assessment and management plan was discussed with the patient. The patient verbalized understanding of  and has agreed to the management plan. Patient is aware to call the clinic if symptoms persist or worsen. Patient is aware when to return to the clinic for a follow-up visit. Patient educated on when it is appropriate to go to the emergency department.   Mary-Margaret Daphine Deutscher, FNP

## 2023-06-21 ENCOUNTER — Telehealth: Payer: BC Managed Care – PPO | Admitting: Nurse Practitioner

## 2023-06-21 DIAGNOSIS — H9209 Otalgia, unspecified ear: Secondary | ICD-10-CM | POA: Diagnosis not present

## 2023-06-21 MED ORDER — FLUTICASONE PROPIONATE 50 MCG/ACT NA SUSP
2.0000 | Freq: Every day | NASAL | 6 refills | Status: DC
Start: 2023-06-21 — End: 2023-08-04

## 2023-06-21 NOTE — Progress Notes (Signed)
E-Visit for Ear Pain - Eustachian Tube Dysfunction   We are sorry that you are not feeling well. Here is how we plan to help!  When your ear feels plugged it is likely due to either wax (cerumen) impaction, or fluid in your middle ear.  I would recommend debrox (this is over the counter) to loosen wax in the eardrum.    The other option is that you have Eustachian Tube Dysfunction.  Eustachian Tube Dysfunction is a condition where the tubes that connect your middle ears to your upper throat become blocked. This can lead to discomfort, hearing difficulties and a feeling of fullness in your ear. Eustachian tube dysfunction usually resolves itself in a few days. The usual symptoms include: Hearing problems Tinnitus, or ringing in your ears Clicking or popping sounds A feeling of fullness in your ears Pain that mimics an ear infection Dizziness, vertigo or balance problems A "tickling" sensation in your ears  ?Eustachian tube dysfunction symptoms may get worse in higher altitudes. This is called barotrauma, and it can happen while scuba diving, flying in an airplane or driving in the mountains.   What causes eustachian tube dysfunction? Allergies and infections (like the common cold and the flu) are the most common causes of eustachian tube dysfunction. These conditions can cause inflammation and mucus buildup, leading to blockage. GERD, or chronic acid reflux, can also cause ETD. This is because stomach acid can back up into your throat and result in inflammation. As mentioned above, altitude changes can also cause ETD.   What are some common eustachian tube dysfunction treatments? In most cases, treatment isn't necessary because ETD often resolves on its own. However, you might need treatment if your symptoms linger for more than two weeks.    Eustachian tube dysfunction treatment depends on the cause and the severity of your condition. Treatments may include home remedies, medications or, in  severe cases, surgery.     HOME CARE: Sometimes simple home remedies can help with mild cases of eustachian tube dysfunction. To try and clear the blockage, you can: Chew gum. Yawn. Swallow. Try the Valsalva maneuver (breathing out forcefully while closing your mouth and pinching your nostrils). Use a saline spray to clear out nasal passages.  MEDICATIONS: Over-the-counter medications can help if allergies are causing eustachian tube dysfunction. Try antihistamines (like cetirizine or diphenhydramine) to ease your symptoms. If you have discomfort, pain relievers -- such as acetaminophen or ibuprofen -- can help.  Sometimes intranasal glucocorticosteroids (like Flonase or Nasacort) help.  I have prescribed Fluticasone 50 mcg/spray 2 sprays in each nostril daily for 10-14 days    GET HELP RIGHT AWAY IF: Fever is over 102.2 degrees. You develop progressive ear pain or hearing loss. Ear symptoms persist longer than 3 days after treatment.  MAKE SURE YOU: Understand these instructions. Will watch your condition. Will get help right away if you are not doing well or get worse.  Thank you for choosing an e-visit.  Your e-visit answers were reviewed by a board certified advanced clinical practitioner to complete your personal care plan. Depending upon the condition, your plan could have included both over the counter or prescription medications.  Please review your pharmacy choice. Make sure the pharmacy is open so you can pick up the prescription now. If there is a problem, you may contact your provider through [employer] and have the prescription routed to another pharmacy.  Your safety is important to [place]. If you have drug allergies check your prescription  carefully.   For the next 24 hours you can use MyChart to ask questions about today's visit, request a non-urgent call back, or ask for a work or school excuse. You will get an email with a survey after your eVisit asking about  your experience. We would appreciate your feedback. I hope that your e-visit has been valuable and will aid in your recovery.  Meds ordered this encounter  Medications   fluticasone (FLONASE) 50 MCG/ACT nasal spray    Sig: Place 2 sprays into both nostrils daily.    Dispense:  16 g    Refill:  6    I spent approximately 5 minutes reviewing the patient's history, current symptoms and coordinating their care today.

## 2023-06-28 ENCOUNTER — Telehealth: Payer: BC Managed Care – PPO | Admitting: Physician Assistant

## 2023-06-28 DIAGNOSIS — H109 Unspecified conjunctivitis: Secondary | ICD-10-CM | POA: Diagnosis not present

## 2023-06-28 MED ORDER — POLYMYXIN B-TRIMETHOPRIM 10000-0.1 UNIT/ML-% OP SOLN: 1.0000 [drp] | OPHTHALMIC | 0 refills | Status: DC

## 2023-06-28 NOTE — Progress Notes (Signed)
E-Visit for Pink Eye   We are sorry that you are not feeling well.  Here is how we plan to help!  Based on what you have shared with me it looks like you have conjunctivitis.  Conjunctivitis is a common inflammatory or infectious condition of the eye that is often referred to as "pink eye".  In most cases it is contagious (viral or bacterial). However, not all conjunctivitis requires antibiotics (ex. Allergic).  We have made appropriate suggestions for you based upon your presentation.  I have prescribed Polytrim Ophthalmic drops 1-2 drops 4 times a day times 5 days  Pink eye can be highly contagious.  It is typically spread through direct contact with secretions, or contaminated objects or surfaces that one may have touched.  Strict handwashing is suggested with soap and water is urged.  If not available, use alcohol based had sanitizer.  Avoid unnecessary touching of the eye.  If you wear contact lenses, you will need to refrain from wearing them until you see no white discharge from the eye for at least 24 hours after being on medication.  You should see symptom improvement in 1-2 days after starting the medication regimen.  Call us if symptoms are not improved in 1-2 days.  Home Care: Wash your hands often! Do not wear your contacts until you complete your treatment plan. Avoid sharing towels, bed linen, personal items with a person who has pink eye. See attention for anyone in your home with similar symptoms.  Get Help Right Away If: Your symptoms do not improve. You develop blurred or loss of vision. Your symptoms worsen (increased discharge, pain or redness)   Thank you for choosing an e-visit.  Your e-visit answers were reviewed by a board certified advanced clinical practitioner to complete your personal care plan. Depending upon the condition, your plan could have included both over the counter or prescription medications.  Please review your pharmacy choice. Make sure the  pharmacy is open so you can pick up prescription now. If there is a problem, you may contact your provider through MyChart messaging and have the prescription routed to another pharmacy.  Your safety is important to us. If you have drug allergies check your prescription carefully.   For the next 24 hours you can use MyChart to ask questions about today's visit, request a non-urgent call back, or ask for a work or school excuse. You will get an email in the next two days asking about your experience. I hope that your e-visit has been valuable and will speed your recovery.  I have spent 5 minutes in review of e-visit questionnaire, review and updating patient chart, medical decision making and response to patient.   Wilkes Potvin M Hennessey Cantrell, PA-C  

## 2023-08-04 ENCOUNTER — Encounter: Payer: Self-pay | Admitting: Nurse Practitioner

## 2023-08-04 ENCOUNTER — Ambulatory Visit: Payer: BC Managed Care – PPO | Admitting: Nurse Practitioner

## 2023-08-04 DIAGNOSIS — F908 Attention-deficit hyperactivity disorder, other type: Secondary | ICD-10-CM

## 2023-08-04 MED ORDER — AMPHETAMINE-DEXTROAMPHET ER 20 MG PO CP24
40.0000 mg | ORAL_CAPSULE | Freq: Every day | ORAL | 0 refills | Status: DC
Start: 2023-08-04 — End: 2023-11-04

## 2023-08-04 MED ORDER — AMPHETAMINE-DEXTROAMPHET ER 20 MG PO CP24
40.0000 mg | ORAL_CAPSULE | Freq: Every day | ORAL | 0 refills | Status: DC
Start: 2023-09-03 — End: 2023-11-04

## 2023-08-04 MED ORDER — AMPHETAMINE-DEXTROAMPHET ER 20 MG PO CP24: 40.0000 mg | ORAL_CAPSULE | Freq: Every day | ORAL | 0 refills | Status: DC

## 2023-08-04 NOTE — Patient Instructions (Signed)

## 2023-08-04 NOTE — Progress Notes (Signed)
Subjective:    Patient ID: Stephanie Buckley, female    DOB: 1976-08-17, 47 y.o.   MRN: [id number]   Chief Complaint: ADHD    HPI:  Stephanie Buckley is a 47 y.o. who identifies as a female who was assigned female at birth.   Social history: Lives with: husband Work history: school system  Adult ADHD- patient has been on adderall for over 10 years. She is not able to concentrate without meds. She denies any meication side effects. She is not able to concentrate at work without meds    Comes in today for follow up of the following chronic medical issues:  There are no diagnoses linked to this encounter.  New complaints: None today  Allergies  Allergen Reactions   Keflex [Cephalexin] Rash   Outpatient Encounter Medications as of 08/04/2023  Medication Sig   amphetamine-dextroamphetamine (ADDERALL XR) 20 MG 24 hr capsule Take 2 capsules (40 mg total) by mouth daily.   amphetamine-dextroamphetamine (ADDERALL XR) 20 MG 24 hr capsule Take 2 capsules (40 mg total) by mouth daily.   amphetamine-dextroamphetamine (ADDERALL XR) 20 MG 24 hr capsule Take 2 capsules (40 mg total) by mouth daily.   trimethoprim-polymyxin b (POLYTRIM) ophthalmic solution Place 1 drop into the right eye every 4 (four) hours. For 5 days   [DISCONTINUED] fluticasone (FLONASE) 50 MCG/ACT nasal spray Place 2 sprays into both nostrils daily.   No facility-administered encounter medications on file as of 08/04/2023.    History reviewed. No pertinent surgical history.  Family History  Problem Relation Age of Onset   Bipolar disorder Father       Controlled substance contract: 02/09/23     Review of Systems  Constitutional:  Negative for diaphoresis.  Eyes:  Negative for pain.  Respiratory:  Negative for shortness of breath.   Cardiovascular:  Negative for chest pain, palpitations and leg swelling.  Gastrointestinal:  Negative for abdominal pain.  Endocrine: Negative for polydipsia.  Skin:  Negative  for rash.  Neurological:  Negative for dizziness, weakness and headaches.  Hematological:  Does not bruise/bleed easily.  All other systems reviewed and are negative.      Objective:   Physical Exam Vitals and nursing note reviewed.  Constitutional:      General: She is not in acute distress.    Appearance: Normal appearance. She is well-developed.  Neck:     Vascular: No carotid bruit or JVD.  Cardiovascular:     Rate and Rhythm: Normal rate and regular rhythm.     Heart sounds: Normal heart sounds.  Pulmonary:     Effort: Pulmonary effort is normal. No respiratory distress.     Breath sounds: Normal breath sounds. No wheezing or rales.  Chest:     Chest wall: No tenderness.  Abdominal:     General: Bowel sounds are normal. There is no distension or abdominal bruit.     Palpations: Abdomen is soft. There is no hepatomegaly, splenomegaly, mass or pulsatile mass.     Tenderness: There is no abdominal tenderness.  Musculoskeletal:        General: Normal range of motion.     Cervical back: Normal range of motion and neck supple.  Lymphadenopathy:     Cervical: No cervical adenopathy.  Skin:    General: Skin is warm and dry.  Neurological:     Mental Status: She is alert and oriented to person, place, and time.     Deep Tendon Reflexes: Reflexes are normal and symmetric.  Psychiatric:        Behavior: Behavior normal.        Thought Content: Thought content normal.        Judgment: Judgment normal.     BP 120/81   Pulse (!) 102   Temp (!) 97.3 F (36.3 C) (Temporal)   Resp 20   Ht 5\' 7"  (1.702 m)   Wt 221 lb (100.2 kg)   SpO2 99%   BMI 34.61 kg/m        Assessment & Plan:   Stephanie Buckley in today with chief complaint of ADHD   1. ADHD, adult residual type Stress management - amphetamine-dextroamphetamine (ADDERALL XR) 20 MG 24 hr capsule; Take 2 capsules (40 mg total) by mouth daily.  Dispense: 60 capsule; Refill: 0 - amphetamine-dextroamphetamine  (ADDERALL XR) 20 MG 24 hr capsule; Take 2 capsules (40 mg total) by mouth daily.  Dispense: 60 capsule; Refill: 0 - amphetamine-dextroamphetamine (ADDERALL XR) 20 MG 24 hr capsule; Take 2 capsules (40 mg total) by mouth daily.  Dispense: 60 capsule; Refill: 0    The above assessment and management plan was discussed with the patient. The patient verbalized understanding of and has agreed to the management plan. Patient is aware to call the clinic if symptoms persist or worsen. Patient is aware when to return to the clinic for a follow-up visit. Patient educated on when it is appropriate to go to the emergency department.   Mary-Margaret Daphine Deutscher, FNP

## 2023-11-04 ENCOUNTER — Ambulatory Visit: Payer: BC Managed Care – PPO | Admitting: Nurse Practitioner

## 2023-11-04 ENCOUNTER — Encounter: Payer: Self-pay | Admitting: Nurse Practitioner

## 2023-11-04 DIAGNOSIS — F908 Attention-deficit hyperactivity disorder, other type: Secondary | ICD-10-CM | POA: Diagnosis not present

## 2023-11-04 MED ORDER — AMPHETAMINE-DEXTROAMPHET ER 20 MG PO CP24: 40.0000 mg | ORAL_CAPSULE | Freq: Every day | ORAL | 0 refills | Status: DC

## 2023-11-04 NOTE — Progress Notes (Signed)
 Subjective:    Patient ID: Stephanie Buckley, female    DOB: [birth year], 48 y.o.   MRN: [id number]   Chief Complaint: ADHD follow up  HPI  Patient has had issues with concentration her whole life. She has been on adderall for many years. Cannot concentrate and get work done without meds. She denies any medication side effect.  Patient Active Problem List   Diagnosis Date Noted   ADHD, adult residual type 05/11/2013       Review of Systems  Constitutional:  Negative for diaphoresis.  Eyes:  Negative for pain.  Respiratory:  Negative for shortness of breath.   Cardiovascular:  Negative for chest pain, palpitations and leg swelling.  Gastrointestinal:  Negative for abdominal pain.  Endocrine: Negative for polydipsia.  Skin:  Negative for rash.  Neurological:  Negative for dizziness, weakness and headaches.  Hematological:  Does not bruise/bleed easily.  All other systems reviewed and are negative.      Objective:   Physical Exam Vitals and nursing note reviewed.  Constitutional:      General: She is not in acute distress.    Appearance: Normal appearance. She is well-developed.  Neck:     Vascular: No carotid bruit or JVD.  Cardiovascular:     Rate and Rhythm: Normal rate and regular rhythm.     Heart sounds: Normal heart sounds.  Pulmonary:     Effort: Pulmonary effort is normal. No respiratory distress.     Breath sounds: Normal breath sounds. No wheezing or rales.  Chest:     Chest wall: No tenderness.  Abdominal:     General: Bowel sounds are normal. There is no distension or abdominal bruit.     Palpations: Abdomen is soft. There is no hepatomegaly, splenomegaly, mass or pulsatile mass.     Tenderness: There is no abdominal tenderness.  Musculoskeletal:        General: Normal range of motion.     Cervical back: Normal range of motion and neck supple.  Lymphadenopathy:     Cervical: No cervical adenopathy.  Skin:    General: Skin is warm and dry.   Neurological:     Mental Status: She is alert and oriented to person, place, and time.     Deep Tendon Reflexes: Reflexes are normal and symmetric.  Psychiatric:        Behavior: Behavior normal.        Thought Content: Thought content normal.        Judgment: Judgment normal.    BP 109/83   Pulse 63   Temp (!) 97.4 F (36.3 C) (Temporal)   Ht 5\' 7"  (1.702 m)   Wt 220 lb (99.8 kg)   BMI 34.46 kg/m          Assessment & Plan:   Stephanie Buckley in today with chief complaint of No chief complaint on file.   1. ADHD, adult residual type Stress management  - amphetamine-dextroamphetamine (ADDERALL XR) 20 MG 24 hr capsule; Take 2 capsules (40 mg total) by mouth daily.  Dispense: 60 capsule; Refill: 0 - amphetamine-dextroamphetamine (ADDERALL XR) 20 MG 24 hr capsule; Take 2 capsules (40 mg total) by mouth daily.  Dispense: 60 capsule; Refill: 0 - amphetamine-dextroamphetamine (ADDERALL XR) 20 MG 24 hr capsule; Take 2 capsules (40 mg total) by mouth daily.  Dispense: 60 capsule; Refill: 0    The above assessment and management plan was discussed with the patient. The patient verbalized understanding of and has agreed  to the management plan. Patient is aware to call the clinic if symptoms persist or worsen. Patient is aware when to return to the clinic for a follow-up visit. Patient educated on when it is appropriate to go to the emergency department.   Mary-Margaret Daphine Deutscher, FNP

## 2024-02-03 ENCOUNTER — Ambulatory Visit: Admitting: Nurse Practitioner

## 2024-02-09 ENCOUNTER — Encounter: Payer: Self-pay | Admitting: Nurse Practitioner

## 2024-02-09 ENCOUNTER — Ambulatory Visit: Admitting: Nurse Practitioner

## 2024-02-09 DIAGNOSIS — F908 Attention-deficit hyperactivity disorder, other type: Secondary | ICD-10-CM

## 2024-02-09 MED ORDER — AMPHETAMINE-DEXTROAMPHET ER 20 MG PO CP24: 40.0000 mg | ORAL_CAPSULE | Freq: Every day | ORAL | 0 refills | Status: DC

## 2024-02-09 NOTE — Progress Notes (Signed)
 Subjective:    Patient ID: Stephanie Buckley, female    DOB: 08-Dec-1975, 48 y.o.   MRN: [id number]   Chief Complaint: ADHD follow up  HPI  Patient has had issues with concentration her whole life. She has been on adderall for many years. Cannot concentrate and get work done without meds. She denies any medication side effect.  Patient Active Problem List   Diagnosis Date Noted   ADHD, adult residual type 05/11/2013       Review of Systems  Constitutional:  Negative for diaphoresis.  Eyes:  Negative for pain.  Respiratory:  Negative for shortness of breath.   Cardiovascular:  Negative for chest pain, palpitations and leg swelling.  Gastrointestinal:  Negative for abdominal pain.  Endocrine: Negative for polydipsia.  Skin:  Negative for rash.  Neurological:  Negative for dizziness, weakness and headaches.  Hematological:  Does not bruise/bleed easily.  All other systems reviewed and are negative.      Objective:   Physical Exam Vitals and nursing note reviewed.  Constitutional:      General: She is not in acute distress.    Appearance: Normal appearance. She is well-developed.  Neck:     Vascular: No carotid bruit or JVD.   Cardiovascular:     Rate and Rhythm: Normal rate and regular rhythm.     Heart sounds: Normal heart sounds.  Pulmonary:     Effort: Pulmonary effort is normal. No respiratory distress.     Breath sounds: Normal breath sounds. No wheezing or rales.  Chest:     Chest wall: No tenderness.  Abdominal:     General: Bowel sounds are normal. There is no distension or abdominal bruit.     Palpations: Abdomen is soft. There is no hepatomegaly, splenomegaly, mass or pulsatile mass.     Tenderness: There is no abdominal tenderness.   Musculoskeletal:        General: Normal range of motion.     Cervical back: Normal range of motion and neck supple.  Lymphadenopathy:     Cervical: No cervical adenopathy.   Skin:    General: Skin is warm and dry.    Neurological:     Mental Status: She is alert and oriented to person, place, and time.     Deep Tendon Reflexes: Reflexes are normal and symmetric.   Psychiatric:        Behavior: Behavior normal.        Thought Content: Thought content normal.        Judgment: Judgment normal.    BP 107/75   Pulse (!) 103   Temp (!) 97.4 F (36.3 C)   Ht 5' 7 (1.702 m)   Wt 219 lb (99.3 kg)   SpO2 94%   BMI 34.30 kg/m           Assessment & Plan:   Orman Blacksmith in today with chief complaint of medical management of chronic issues    1. ADHD, adult residual type Stress management  - amphetamine -dextroamphetamine (ADDERALL XR) 20 MG 24 hr capsule; Take 2 capsules (40 mg total) by mouth daily.  Dispense: 60 capsule; Refill: 0 - amphetamine -dextroamphetamine (ADDERALL XR) 20 MG 24 hr capsule; Take 2 capsules (40 mg total) by mouth daily.  Dispense: 60 capsule; Refill: 0 - amphetamine -dextroamphetamine (ADDERALL XR) 20 MG 24 hr capsule; Take 2 capsules (40 mg total) by mouth daily.  Dispense: 60 capsule; Refill: 0    The above assessment and management plan was discussed  with the patient. The patient verbalized understanding of and has agreed to the management plan. Patient is aware to call the clinic if symptoms persist or worsen. Patient is aware when to return to the clinic for a follow-up visit. Patient educated on when it is appropriate to go to the emergency department.   Mary-Margaret Gaylyn Keas, FNP

## 2024-05-08 ENCOUNTER — Ambulatory Visit: Admitting: Nurse Practitioner

## 2024-05-08 DIAGNOSIS — F908 Attention-deficit hyperactivity disorder, other type: Secondary | ICD-10-CM

## 2024-05-08 MED ORDER — AMPHETAMINE-DEXTROAMPHET ER 20 MG PO CP24: 40.0000 mg | ORAL_CAPSULE | Freq: Every day | ORAL | 0 refills | Status: DC

## 2024-05-08 NOTE — Progress Notes (Signed)
 Subjective:    Patient ID: Stephanie Buckley, female    DOB: 01/12/1976, 48 y.o.   MRN: [id number]   Chief Complaint: ADHD follow up  HPI  Patient has had issues with concentration her whole life. She has been on adderall for many years. Cannot concentrate and get work done without meds. She denies any medication side effect.  Patient Active Problem List   Diagnosis Date Noted   ADHD, adult residual type 05/11/2013       Review of Systems  Constitutional:  Negative for diaphoresis.  Eyes:  Negative for pain.  Respiratory:  Negative for shortness of breath.   Cardiovascular:  Negative for chest pain, palpitations and leg swelling.  Gastrointestinal:  Negative for abdominal pain.  Endocrine: Negative for polydipsia.  Skin:  Negative for rash.  Neurological:  Negative for dizziness, weakness and headaches.  Hematological:  Does not bruise/bleed easily.  All other systems reviewed and are negative.      Objective:   Physical Exam Vitals and nursing note reviewed.  Constitutional:      General: She is not in acute distress.    Appearance: Normal appearance. She is well-developed.  Neck:     Vascular: No carotid bruit or JVD.  Cardiovascular:     Rate and Rhythm: Normal rate and regular rhythm.     Heart sounds: Normal heart sounds.  Pulmonary:     Effort: Pulmonary effort is normal. No respiratory distress.     Breath sounds: Normal breath sounds. No wheezing or rales.  Chest:     Chest wall: No tenderness.  Abdominal:     General: There is no abdominal bruit.     Palpations: There is no hepatomegaly, splenomegaly or pulsatile mass.     Tenderness: There is no abdominal tenderness.  Musculoskeletal:        General: Normal range of motion.     Cervical back: Normal range of motion and neck supple.  Lymphadenopathy:     Cervical: No cervical adenopathy.  Skin:    General: Skin is warm and dry.  Neurological:     Mental Status: She is alert and oriented to person,  place, and time.     Deep Tendon Reflexes: Reflexes are normal and symmetric.  Psychiatric:        Behavior: Behavior normal.        Thought Content: Thought content normal.        Judgment: Judgment normal.    BP 117/80   Pulse 86   Temp (!) 97.3 F (36.3 C) (Temporal)   Ht 5' 7 (1.702 m)   Wt 222 lb (100.7 kg)   SpO2 99%   BMI 34.77 kg/m           Assessment & Plan:   Lamond JAYSON Plough in today with chief complaint of medical management of chronic issues    1. ADHD, adult residual type Stress management  - amphetamine -dextroamphetamine (ADDERALL XR) 20 MG 24 hr capsule; Take 2 capsules (40 mg total) by mouth daily.  Dispense: 60 capsule; Refill: 0 - amphetamine -dextroamphetamine (ADDERALL XR) 20 MG 24 hr capsule; Take 2 capsules (40 mg total) by mouth daily.  Dispense: 60 capsule; Refill: 0 - amphetamine -dextroamphetamine (ADDERALL XR) 20 MG 24 hr capsule; Take 2 capsules (40 mg total) by mouth daily.  Dispense: 60 capsule; Refill: 0    The above assessment and management plan was discussed with the patient. The patient verbalized understanding of and has agreed to the management plan.  Patient is aware to call the clinic if symptoms persist or worsen. Patient is aware when to return to the clinic for a follow-up visit. Patient educated on when it is appropriate to go to the emergency department.   Mary-Margaret Gladis, FNP

## 2024-08-07 ENCOUNTER — Encounter: Payer: Self-pay | Admitting: Nurse Practitioner

## 2024-08-07 ENCOUNTER — Ambulatory Visit: Payer: Self-pay | Admitting: Nurse Practitioner

## 2024-08-07 DIAGNOSIS — F908 Attention-deficit hyperactivity disorder, other type: Secondary | ICD-10-CM

## 2024-08-07 MED ORDER — AMPHETAMINE-DEXTROAMPHET ER 20 MG PO CP24: 40.0000 mg | ORAL_CAPSULE | Freq: Every day | ORAL | 0 refills | Status: AC

## 2024-08-07 NOTE — Progress Notes (Signed)
 Subjective:    Patient ID: Stephanie Buckley, female    DOB: 03/10/1976, 48 y.o.   MRN: [id number]   Chief Complaint: ADHD follow up  HPI  Patient has had issues with concentration her whole life. She has been on adderall for many years. Cannot concentrate and get work done without meds. She denies any medication side effect.  Patient Active Problem List   Diagnosis Date Noted   ADHD, adult residual type 05/11/2013       Review of Systems  Constitutional:  Negative for diaphoresis.  Eyes:  Negative for pain.  Respiratory:  Negative for shortness of breath.   Cardiovascular:  Negative for chest pain, palpitations and leg swelling.  Gastrointestinal:  Negative for abdominal pain.  Endocrine: Negative for polydipsia.  Skin:  Negative for rash.  Neurological:  Negative for dizziness, weakness and headaches.  Hematological:  Does not bruise/bleed easily.  All other systems reviewed and are negative.      Objective:   Physical Exam Vitals and nursing note reviewed.  Constitutional:      General: She is not in acute distress.    Appearance: Normal appearance. She is well-developed.  Neck:     Vascular: No carotid bruit or JVD.  Cardiovascular:     Rate and Rhythm: Normal rate and regular rhythm.     Heart sounds: Normal heart sounds.  Pulmonary:     Effort: Pulmonary effort is normal. No respiratory distress.     Breath sounds: Normal breath sounds. No wheezing or rales.  Chest:     Chest wall: No tenderness.  Abdominal:     General: There is no abdominal bruit.     Palpations: There is no hepatomegaly, splenomegaly or pulsatile mass.     Tenderness: There is no abdominal tenderness.  Musculoskeletal:        General: Normal range of motion.     Cervical back: Normal range of motion and neck supple.  Lymphadenopathy:     Cervical: No cervical adenopathy.  Skin:    General: Skin is warm and dry.  Neurological:     Mental Status: She is alert and oriented to person,  place, and time.     Deep Tendon Reflexes: Reflexes are normal and symmetric.  Psychiatric:        Behavior: Behavior normal.        Thought Content: Thought content normal.        Judgment: Judgment normal.    BP 119/84   Pulse (!) 102   Temp (!) 97.4 F (36.3 C) (Temporal)   Ht 5' 7 (1.702 m)   Wt 221 lb (100.2 kg)   SpO2 98%   BMI 34.61 kg/m           Assessment & Plan:   Lamond JAYSON Plough in today with chief complaint of medical management of chronic issues    1. ADHD, adult residual type Stress management  - amphetamine -dextroamphetamine (ADDERALL XR) 20 MG 24 hr capsule; Take 2 capsules (40 mg total) by mouth daily.  Dispense: 60 capsule; Refill: 0 - amphetamine -dextroamphetamine (ADDERALL XR) 20 MG 24 hr capsule; Take 2 capsules (40 mg total) by mouth daily.  Dispense: 60 capsule; Refill: 0 - amphetamine -dextroamphetamine (ADDERALL XR) 20 MG 24 hr capsule; Take 2 capsules (40 mg total) by mouth daily.  Dispense: 60 capsule; Refill: 0    The above assessment and management plan was discussed with the patient. The patient verbalized understanding of and has agreed to the management  plan. Patient is aware to call the clinic if symptoms persist or worsen. Patient is aware when to return to the clinic for a follow-up visit. Patient educated on when it is appropriate to go to the emergency department.   Mary-Margaret Gladis, FNP

## 2024-08-09 LAB — TOXASSURE SELECT 13 (MW), URINE

## 2024-10-26 ENCOUNTER — Ambulatory Visit: Admitting: Nurse Practitioner
